# Patient Record
Sex: Male | Born: 1955 | Race: White | Hispanic: No | Marital: Married | Smoking: Never smoker
Health system: Southern US, Community
[De-identification: ages and names within clinical notes are randomized; demographics above are authoritative.]

## PROBLEM LIST (undated history)

## (undated) DIAGNOSIS — Z87442 Personal history of urinary calculi: Secondary | ICD-10-CM

## (undated) DIAGNOSIS — M109 Gout, unspecified: Secondary | ICD-10-CM

## (undated) DIAGNOSIS — I83893 Varicose veins of bilateral lower extremities with other complications: Secondary | ICD-10-CM

## (undated) DIAGNOSIS — I10 Essential (primary) hypertension: Secondary | ICD-10-CM

## (undated) DIAGNOSIS — N433 Hydrocele, unspecified: Principal | ICD-10-CM

## (undated) DIAGNOSIS — K429 Umbilical hernia without obstruction or gangrene: Secondary | ICD-10-CM

## (undated) DIAGNOSIS — R109 Unspecified abdominal pain: Secondary | ICD-10-CM

## (undated) DIAGNOSIS — N5089 Other specified disorders of the male genital organs: Secondary | ICD-10-CM

## (undated) DIAGNOSIS — IMO0002 Reserved for concepts with insufficient information to code with codable children: Secondary | ICD-10-CM

## (undated) DIAGNOSIS — E785 Hyperlipidemia, unspecified: Secondary | ICD-10-CM

## (undated) HISTORY — DX: Varicose veins of bilateral lower extremities with other complications: I83.893

## (undated) HISTORY — DX: Personal history of urinary calculi: Z87.442

## (undated) HISTORY — DX: Hyperlipidemia, unspecified: E78.5

## (undated) HISTORY — DX: Essential (primary) hypertension: I10

## (undated) HISTORY — DX: Umbilical hernia without obstruction or gangrene: K42.9

## (undated) HISTORY — DX: Reserved for concepts with insufficient information to code with codable children: IMO0002

## (undated) HISTORY — DX: Other specified disorders of the male genital organs: N50.89

## (undated) HISTORY — DX: Gout, unspecified: M10.9

## (undated) HISTORY — DX: Hydrocele, unspecified: N43.3

## (undated) HISTORY — DX: Unspecified abdominal pain: R10.9

## (undated) HISTORY — PX: COLONOSCOPY: SHX174

---

## 2008-11-04 ENCOUNTER — Ambulatory Visit: Payer: Self-pay | Admitting: General Surgery

## 2008-11-04 LAB — HM COLONOSCOPY

## 2011-11-22 DIAGNOSIS — I83893 Varicose veins of bilateral lower extremities with other complications: Secondary | ICD-10-CM

## 2011-11-22 HISTORY — DX: Varicose veins of bilateral lower extremities with other complications: I83.893

## 2011-11-22 HISTORY — PX: KNEE SURGERY: SHX244

## 2011-12-13 ENCOUNTER — Ambulatory Visit: Payer: Self-pay | Admitting: Family Medicine

## 2011-12-13 LAB — RENAL FUNCTION PANEL
Albumin: 4 g/dL (ref 3.4–5.0)
Anion Gap: 9 (ref 7–16)
Chloride: 107 mmol/L (ref 98–107)
Co2: 30 mmol/L (ref 21–32)
Creatinine: 0.97 mg/dL (ref 0.60–1.30)
EGFR (Non-African Amer.): >60
Glucose: 90 mg/dL (ref 65–99)
Phosphorus: 3.5 mg/dL (ref 2.5–4.9)
Sodium: 146 mmol/L — ABNORMAL HIGH (ref 136–145)

## 2011-12-25 ENCOUNTER — Ambulatory Visit: Payer: Self-pay | Admitting: Specialist

## 2012-01-05 ENCOUNTER — Ambulatory Visit: Payer: Self-pay | Admitting: Specialist

## 2012-01-12 ENCOUNTER — Ambulatory Visit: Payer: Self-pay | Admitting: Specialist

## 2012-03-28 ENCOUNTER — Ambulatory Visit: Payer: Self-pay | Admitting: General Surgery

## 2012-03-30 LAB — PATHOLOGY REPORT

## 2013-01-11 ENCOUNTER — Encounter: Payer: Self-pay | Admitting: *Deleted

## 2013-02-26 ENCOUNTER — Ambulatory Visit: Payer: Self-pay | Admitting: General Surgery

## 2013-12-02 ENCOUNTER — Other Ambulatory Visit: Payer: Self-pay | Admitting: *Deleted

## 2014-07-10 ENCOUNTER — Encounter: Payer: Self-pay | Admitting: *Deleted

## 2014-07-23 ENCOUNTER — Encounter: Payer: Self-pay | Admitting: General Surgery

## 2014-07-24 ENCOUNTER — Ambulatory Visit (INDEPENDENT_AMBULATORY_CARE_PROVIDER_SITE_OTHER): Payer: No Typology Code available for payment source | Admitting: General Surgery

## 2014-07-24 ENCOUNTER — Encounter: Payer: Self-pay | Admitting: General Surgery

## 2014-07-24 VITALS — BP 142/72 | Ht 71.0 in | Wt 274.0 lb

## 2014-07-24 DIAGNOSIS — K429 Umbilical hernia without obstruction or gangrene: Secondary | ICD-10-CM

## 2014-07-24 DIAGNOSIS — N433 Hydrocele, unspecified: Secondary | ICD-10-CM

## 2014-07-24 LAB — BASIC METABOLIC PANEL
BUN: 11 mg/dL (ref 4–21)
Creatinine: 0.8 mg/dL (ref 0.6–1.3)
GLUCOSE: 95 mg/dL
Potassium: 4.2 mmol/L (ref 3.4–5.3)
Sodium: 145 mmol/L (ref 137–147)

## 2014-07-24 LAB — CBC AND DIFFERENTIAL
HCT: 40 % — AB (ref 41–53)
Hemoglobin: 14.1 g/dL (ref 13.5–17.5)
NEUTROS ABS: 1 /uL
PLATELETS: 175 10*3/uL (ref 150–399)
WBC: 3.5 10^3/mL

## 2014-07-24 LAB — HEPATIC FUNCTION PANEL
ALT: 18 U/L (ref 10–40)
AST: 16 U/L (ref 14–40)
Alkaline Phosphatase: 59 U/L (ref 25–125)
Bilirubin, Total: 0.4 mg/dL

## 2014-07-24 LAB — TSH: TSH: 2.12 u[IU]/mL (ref 0.41–5.90)

## 2014-07-24 LAB — LIPID PANEL
CHOLESTEROL: 209 mg/dL — AB (ref 0–200)
HDL: 57 mg/dL (ref 35–70)
LDL Cholesterol: 131 mg/dL
LDL/HDL RATIO: 2.3
Triglycerides: 105 mg/dL (ref 40–160)

## 2014-07-24 LAB — PSA: PSA: 0.3

## 2014-07-24 NOTE — Patient Instructions (Addendum)
Patient advised to see Urology for hydroceles. Patient to return as needed. The patient is aware to call back for any questions or concerns.  Patient has been scheduled for an appointment with Dr. Rick Duff at [place] for 08-13-14 at 9 am for further evaluation.

## 2014-07-24 NOTE — Progress Notes (Signed)
Patient ID: Larry KOLANDER Sr., male   DOB: 1956-03-08, 58 y.o.   MRN: [id number]  Chief Complaint  Patient presents with  . Other    umbilical hernia    HPIt  Larry CHIO Sr. is a 58 y.o. male here today for a evaluation of a umbilical hernia as well as progressive scrotal swellng. The patient states the hernia has been there for approximately 5 years without change or discomfort. He denies any pain in that area and states it doesn't bother him in any way.  The scrotal swelling is gradually been increasing over the last several years. He finds it uncomfortable and he is required to ride for long distances.   HPI  Past Medical History  Diagnosis Date  . Varicose veins of lower extremities with other complications 1829    Past Surgical History  Procedure Laterality Date  . Colonoscopy  2009, 2013    2009, tubulovillous adenoma the transverse colon, tubular adenoma at 30 cm.2013: Benign lymphoid mucosa at 25 cm.  . Knee surgery Right 2013    Family History  Problem Relation Age of Onset  . Colon cancer    . Colon polyps      Social History History  Substance Use Topics  . Smoking status: Never Smoker   . Smokeless tobacco: Not on file  . Alcohol Use: Yes    No Known Allergies  Current Outpatient Prescriptions  Medication Sig Dispense Refill  . AZOR 5-40 MG per tablet 1 tablet daily.       . fluticasone (FLONASE) 50 MCG/ACT nasal spray Place 1 spray into both nostrils daily.      . montelukast (SINGULAIR) 10 MG tablet Take 10 mg by mouth at bedtime.       . Olmesartan-Amlodipine-HCTZ 20-5-12.5 MG TABS Take by mouth.       No current facility-administered medications for this visit.    Review of Systems Review of Systems  Constitutional: Negative.   Respiratory: Negative.   Cardiovascular: Negative.     Blood pressure 142/72, height 5\' 11"  (1.803 m), weight 274 lb (124.286 kg).  Physical Exam Physical Exam  Constitutional: He is oriented to person, place, and  time. He appears well-developed and well-nourished.  Cardiovascular: Normal rate, regular rhythm and normal heart sounds.   Pulmonary/Chest: Effort normal and breath sounds normal.  Abdominal: Soft. Bowel sounds are normal. There is no hepatosplenomegaly. There is no tenderness. A hernia (2 cm defect at the umbilicus) is present.  Genitourinary:  Right testis is slightly larger than left.  Bilateral hydroceles that transilluminate.  Neurological: He is alert and oriented to person, place, and time.  Skin: Skin is warm and dry.    Data Reviewed PCP notes of July 09, 2014  Assessment    Symptomatic scrotal hydrocele.  Asymptomatic umbilical hernia.    Plan    Patient has been scheduled for an appointment with Dr. Rick Duff at [place] for 08-13-14 at 9 am for further evaluation. The patient is not having any symptoms from his umbilical hernia. If he is going undergoing anesthesia for repair of his hydrocele, he may consider elective repair at the same setting.    PCP and Ref. Dr. Juliette Mangle, [place] 07/25/2014, 7:42 PM

## 2014-07-25 ENCOUNTER — Encounter: Payer: Self-pay | Admitting: General Surgery

## 2014-07-25 DIAGNOSIS — N433 Hydrocele, unspecified: Secondary | ICD-10-CM

## 2014-07-25 DIAGNOSIS — K429 Umbilical hernia without obstruction or gangrene: Secondary | ICD-10-CM

## 2014-07-25 HISTORY — DX: Umbilical hernia without obstruction or gangrene: K42.9

## 2014-07-25 HISTORY — DX: Hydrocele, unspecified: N43.3

## 2015-03-15 NOTE — Op Note (Signed)
PATIENT NAME:  Larry Benjamin, Larry Benjamin MR#:  [id number] DATE OF BIRTH:  01-12-1956  DATE OF PROCEDURE:  01/12/2012  PREOPERATIVE DIAGNOSES:  1. Macerated complex tear of the posterior horn right medial meniscus.  2. 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  3. Moderate synovitis.   POSTOPERATIVE DIAGNOSES:  1. Macerated complex tear of the posterior horn right medial meniscus.  2. 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  3. Moderate synovitis.   PROCEDURES:  1. Arthroscopic partial right medial meniscectomy.  2. Arthroscopic chondroplasty lateral femoral condyle.  3. Arthroscopic partial synovectomy.   SURGEON: Park Breed, MD  ANESTHESIA: General LMA.   COMPLICATIONS: None.   DRAINS: None.   ESTIMATED BLOOD LOSS: Minimal.   REPLACEMENTS: None.   OPERATIVE FINDINGS: The patient had a complex macerated tear of the posterior horn of the medial meniscus. There was no significant degenerative change of the medial femoral condyle and minimal changes in the tibia. The intercondylar notch was normal with intact anterior and posterior cruciate ligaments. There was some moderate synovitis anteriorly. The lateral joint showed an intact meniscus and articular surfaces except for some grade III chondromalacia on the outer aspect of the lateral femoral condyle which was not significantly weight-bearing. Patellofemoral joint was normal. There were no loose bodies.   DESCRIPTION OF PROCEDURE: The patient was brought to the Operating Room where he underwent satisfactory general LMA anesthesia in the supine position. The right leg was prepped and draped in sterile fashion. Arthroscopy was carried out through standard portals. The above findings were encountered upon arthroscopy. The medial meniscus was debrided with basket forceps, motorized resector and ArthroCare wand until it was back to healthy stable tissue. Moderate synovectomy was carried out anteriorly for  visualization only. The lateral compartment showed an intact meniscus. There was some grade III chondromalacia on the anterior outer aspect of the lateral femoral condyle which was debrided with basket forceps and a motorized resector. The ArthroCare wand was used to cauterize any bleeders as the pressure was reduced. The joint was then thoroughly irrigated free of debris and stab wounds closed with 3-0 nylon suture. 0.5% Marcaine with epinephrine and morphine was placed in the joint. Dry sterile dressing was applied. Tourniquet was not used. The patient was awakened and taken to recovery in good condition. Patient has had some swelling in the leg and we will have him use below the knee TED hose for 2 to 3 weeks. In addition, he has been getting some redness in the lower leg during the day and we will keep him on Keflex 500 mg q.8 hours as a prophylaxis even though there is no evidence of cellulitis at this time.  ____________________________ Park Breed, MD hem:cms D: 01/12/2012 10:25:41 ET T: 01/12/2012 10:40:40 ET JOB#: [id number]  cc: Park Breed, MD, <Dictator> Park Breed MD ELECTRONICALLY SIGNED 01/13/2012 10:17

## 2015-06-30 ENCOUNTER — Encounter: Payer: Self-pay | Admitting: Urology

## 2015-06-30 ENCOUNTER — Ambulatory Visit (INDEPENDENT_AMBULATORY_CARE_PROVIDER_SITE_OTHER): Payer: No Typology Code available for payment source | Admitting: Urology

## 2015-06-30 VITALS — BP 157/109 | HR 68 | Ht 71.0 in | Wt 283.4 lb

## 2015-06-30 DIAGNOSIS — N433 Hydrocele, unspecified: Secondary | ICD-10-CM | POA: Diagnosis not present

## 2015-06-30 NOTE — Progress Notes (Signed)
06/30/2015 1:19 PM   Larry Bi Sr. 06/19/1956 [id number]  Referring provider: Jerrol Banana., MD [address] [facility] [place], [place] [address]  Chief Complaint  Patient presents with  . Pre-op Exam    [id number] saw me last October. Hydroceles at that point and I recommended waiting until he became uncomfortable. They're now becoming uncomfortable. Quite a large right hydrocele and a good size left hydrocele. Plan to excise these. Surgery is explained including imbrication of the hydrocele sac. Patient have a drain 48 hours postop. He realizes this is a neighbor of his abdomen recent surgery. I told him I would see him 3 weeks postop. HPI   PMH: Past Medical History  Diagnosis Date  . Varicose veins of lower extremities with other complications 7253  . History of kidney stones   . Gout   . Scrotal swelling   . Umbilical hernia   . Bilateral hydrocele   . Hypertension     Surgical History: Past Surgical History  Procedure Laterality Date  . Colonoscopy  2009, 2013    2009, tubulovillous adenoma the transverse colon, tubular adenoma at 30 cm.2013: Benign lymphoid mucosa at 25 cm.  . Knee surgery Right 2013    Home Medications:    Medication List       This list is accurate as of: 06/30/15  1:19 PM.  Always use your most recent med list.               AZOR 5-40 MG per tablet  Generic drug:  amLODipine-olmesartan  1 tablet daily.     fluticasone 50 MCG/ACT nasal spray  Commonly known as:  FLONASE  Place 1 spray into both nostrils daily.     montelukast 10 MG tablet  Commonly known as:  SINGULAIR  Take 10 mg by mouth at bedtime.        Allergies: No Known Allergies  Family History: Family History  Problem Relation Age of Onset  . Colon cancer    . Colon polyps    . Stroke Father     Social History:  reports that he has never smoked. He does not have any smokeless tobacco history on file. He reports that he drinks alcohol. He  reports that he does not use illicit drugs.  ROS: UROLOGY Frequent Urination?: No Hard to postpone urination?: No Burning/pain with urination?: No Get up at night to urinate?: No Leakage of urine?: No Urine stream starts and stops?: No Trouble starting stream?: No Do you have to strain to urinate?: No Blood in urine?: No Urinary tract infection?: No Sexually transmitted disease?: No Injury to kidneys or bladder?: No Painful intercourse?: No Weak stream?: No Erection problems?: No Penile pain?: No  Gastrointestinal Nausea?: No Vomiting?: No Indigestion/heartburn?: No Diarrhea?: No Constipation?: No  Constitutional Fever: No Night sweats?: No Weight loss?: No Fatigue?: No  Skin Skin rash/lesions?: No Itching?: No  Eyes Blurred vision?: No Double vision?: No  Ears/Nose/Throat Sore throat?: No Sinus problems?: Yes  Hematologic/Lymphatic Swollen glands?: No Easy bruising?: No  Cardiovascular Leg swelling?: No Chest pain?: No  Respiratory Cough?: No Shortness of breath?: No  Endocrine Excessive thirst?: No  Musculoskeletal Back pain?: No Joint pain?: Yes  Neurological Headaches?: No Dizziness?: No  Psychologic Depression?: No Anxiety?: No  Physical Exam: BP 157/109 mmHg  Pulse 68  Ht 5\' 11"  (1.803 m)  Wt 283 lb 6.4 oz (128.549 kg)  BMI 39.54 kg/m2  Constitutional:  Alert and oriented, No acute distress.  HEENT: [place] AT, moist mucus membranes.  Trachea midline, no masses. Cardiovascular: No clubbing, cyanosis, or edema. Respiratory: Normal respiratory effort, no increased work of breathing. GI: Abdomen is soft, nontender, nondistended, no abdominal masses GU: No CVA tenderness. Bilateral hydroceles right larger than left Skin: No rashes, bruises or suspicious lesions. Lymph: No cervical or inguinal adenopathy. Neurologic: Grossly intact, no focal deficits, moving all 4 extremities. Psychiatric: Normal mood and affect.  Laboratory  Data: No results found for: WBC, HGB, HCT, MCV, PLT  Lab Results  Component Value Date   CREATININE 0.97 12/13/2011    No results found for: PSA  No results found for: TESTOSTERONE  No results found for: HGBA1C  Urinalysis No results found for: COLORURINE, APPEARANCEUR, LABSPEC, PHURINE, GLUCOSEU, HGBUR, BILIRUBINUR, KETONESUR, PROTEINUR, UROBILINOGEN, NITRITE, LEUKOCYTESUR  Pertinent Imaging: none  Assessment and Plan: bilateral hydroceles and plan excision of hydroceles through the scrotal approach      Problem List Items Addressed This Visit    None      No Follow-up on file.  Collier Flowers, [place] [address], [place], [place] [address] [phone]

## 2015-07-27 ENCOUNTER — Other Ambulatory Visit: Payer: Self-pay | Admitting: Family Medicine

## 2015-09-02 ENCOUNTER — Other Ambulatory Visit: Payer: Self-pay | Admitting: Family Medicine

## 2015-09-02 DIAGNOSIS — E785 Hyperlipidemia, unspecified: Secondary | ICD-10-CM

## 2015-09-02 DIAGNOSIS — Q792 Exomphalos: Secondary | ICD-10-CM | POA: Insufficient documentation

## 2015-09-02 DIAGNOSIS — IMO0002 Reserved for concepts with insufficient information to code with codable children: Secondary | ICD-10-CM

## 2015-09-02 DIAGNOSIS — J309 Allergic rhinitis, unspecified: Secondary | ICD-10-CM | POA: Insufficient documentation

## 2015-09-02 DIAGNOSIS — I10 Essential (primary) hypertension: Secondary | ICD-10-CM

## 2015-09-02 HISTORY — DX: Essential (primary) hypertension: I10

## 2015-09-02 HISTORY — DX: Reserved for concepts with insufficient information to code with codable children: IMO0002

## 2015-09-02 HISTORY — DX: Hyperlipidemia, unspecified: E78.5

## 2015-09-03 ENCOUNTER — Ambulatory Visit (INDEPENDENT_AMBULATORY_CARE_PROVIDER_SITE_OTHER): Payer: No Typology Code available for payment source | Admitting: Family Medicine

## 2015-09-03 ENCOUNTER — Encounter: Payer: Self-pay | Admitting: Family Medicine

## 2015-09-03 VITALS — BP 132/86 | HR 86 | Temp 97.5°F | Resp 16 | Ht 71.0 in | Wt 283.0 lb

## 2015-09-03 DIAGNOSIS — K429 Umbilical hernia without obstruction or gangrene: Secondary | ICD-10-CM | POA: Diagnosis not present

## 2015-09-03 DIAGNOSIS — N432 Other hydrocele: Secondary | ICD-10-CM | POA: Diagnosis not present

## 2015-09-03 DIAGNOSIS — Z0000 Encounter for general adult medical examination without abnormal findings: Secondary | ICD-10-CM

## 2015-09-03 DIAGNOSIS — Z125 Encounter for screening for malignant neoplasm of prostate: Secondary | ICD-10-CM | POA: Diagnosis not present

## 2015-09-03 LAB — IFOBT (OCCULT BLOOD): IMMUNOLOGICAL FECAL OCCULT BLOOD TEST: NEGATIVE

## 2015-09-03 LAB — POCT URINALYSIS DIPSTICK
BILIRUBIN UA: NEGATIVE
GLUCOSE UA: NEGATIVE
Ketones, UA: NEGATIVE
LEUKOCYTES UA: NEGATIVE
NITRITE UA: NEGATIVE
PH UA: 6
Spec Grav, UA: 1.025
UROBILINOGEN UA: 0.2

## 2015-09-03 NOTE — Progress Notes (Signed)
Patient ID: Larry ROSAS Sr., male   DOB: Oct 30, 1956, 59 y.o.   MRN: [id number]  Visit Date: 09/03/2015  Today's Provider: Wilhemena Durie, MD   Chief Complaint  Patient presents with  . Annual Exam   Subjective:  Larry GAMEL Sr. is a 60 y.o. male who presents today for health maintenance and complete physical. He feels well. He reports exercising is none but stays active daily. He reports he is sleeping well.  LAST  colonoscopy 03/28/12-diverticulosis, 1 polyp-no path report  Tdap 09/10/08   Review of Systems  Constitutional: Negative.   HENT: Negative.   Eyes: Negative.   Respiratory: Negative.   Cardiovascular: Negative.   Endocrine: Negative.   Genitourinary: Positive for scrotal swelling.  Musculoskeletal: Negative.   Skin: Negative.   Allergic/Immunologic: Positive for environmental allergies.  Neurological: Negative.   Hematological: Negative.   Psychiatric/Behavioral: Positive for confusion.    Social History   Social History  . Marital Status: Married    Spouse Name: N/A  . Number of Children: N/A  . Years of Education: N/A   Occupational History  . Not on file.   Social History Main Topics  . Smoking status: Never Smoker   . Smokeless tobacco: Never Used  . Alcohol Use: Yes  . Drug Use: No  . Sexual Activity: Not on file   Other Topics Concern  . Not on file   Social History Narrative    Patient Active Problem List   Diagnosis Date Noted  . Allergic rhinitis 09/02/2015  . HLD (hyperlipidemia) 09/02/2015  . BP (high blood pressure) 09/02/2015  . Adult BMI 30+ 09/02/2015  . 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 Umbilical hernia without obstruction and without gangrene 07/25/2014    Past Surgical History  Procedure Laterality Date  . Colonoscopy  2009, 2013    2009, tubulovillous adenoma the transverse colon, tubular adenoma at 30 cm.2013: Benign lymphoid mucosa at 25 cm.  . Knee surgery Right 2013    His family  history includes Breast cancer in his mother; Colon cancer in an other family member; Colon polyps in an other family member; Heart attack (age of onset: 33) in his father; Hyperlipidemia in his mother; Stroke in his father.    Outpatient Prescriptions Prior to Visit  Medication Sig Dispense Refill  . AZOR 5-40 MG tablet take 1 tablet by mouth once daily 30 tablet 5  . fluticasone (FLONASE) 50 MCG/ACT nasal spray Place 1 spray into both nostrils daily.    Marland Kitchen aspirin 325 MG tablet Take by mouth.    . montelukast (SINGULAIR) 10 MG tablet Take 10 mg by mouth at bedtime.      No facility-administered medications prior to visit.    Patient Care Team: Jerrol Banana., MD as PCP - General (Family Medicine) Jerrol Banana., MD (Family Medicine) Robert Bellow, MD (General Surgery)     Objective:   Vitals:  Filed Vitals:   09/03/15 1017  BP: 132/86  Pulse: 86  Temp: 97.5 F (36.4 C)  Resp: 16  Height: 5\' 11"  (1.803 m)  Weight: 283 lb (128.368 kg)    Physical Exam  Constitutional: He is oriented to person, place, and time. He appears well-developed and well-nourished.  Obese white male in no acute distress  HENT:  Head: Normocephalic and atraumatic.  Right Ear: External ear normal.  Left Ear: External ear normal.  Nose: Nose normal.  Mouth/Throat: Oropharynx is clear and moist.  Eyes: Conjunctivae and EOM are normal. Pupils are equal, round, and reactive to light.  Neck: Normal range of motion. Neck supple.  Cardiovascular: Normal rate, regular rhythm, normal heart sounds and intact distal pulses.   Pulmonary/Chest: Effort normal and breath sounds normal.  Abdominal: Soft.  Moderate size, easily reducible umbilical hernia  Genitourinary: Rectum normal, prostate normal and penis normal.  Large right hydrocele noted  Neurological: He is alert and oriented to person, place, and time.  Skin: Skin is warm and dry.  Psychiatric: He has a normal mood and affect. His  behavior is normal. Judgment and thought content normal.     Depression Screen PHQ 2/9 Scores 09/03/2015  PHQ - 2 Score 0      Assessment & Plan:   1. Annual physical exam - CBC with Differential/Platelet - Comprehensive metabolic panel - TSH - Lipid Panel With LDL/HDL Ratio - POCT urinalysis dipstick  2. Prostate cancer screening  - PSA - IFOBT POC (occult bld, rslt in office)  3. Umbilical hernia without obstruction and without gangrene  - Ambulatory referral to General Surgery  4. Other hydrocele  - Ambulatory referral to Urology  5. Obesity Diet and exercise habits stressed to patient.   I have done the exam and reviewed the above chart and it is accurate to the best of my knowledge.

## 2015-09-05 LAB — CBC WITH DIFFERENTIAL/PLATELET
BASOS ABS: 0 10*3/uL (ref 0.0–0.2)
Basos: 1 %
EOS (ABSOLUTE): 0.3 10*3/uL (ref 0.0–0.4)
Eos: 8 %
Hematocrit: 42.3 % (ref 37.5–51.0)
Hemoglobin: 14.8 g/dL (ref 12.6–17.7)
IMMATURE GRANS (ABS): 0 10*3/uL (ref 0.0–0.1)
Immature Granulocytes: 0 %
LYMPHS: 40 %
Lymphocytes Absolute: 1.4 10*3/uL (ref 0.7–3.1)
MCH: 31.5 pg (ref 26.6–33.0)
MCHC: 35 g/dL (ref 31.5–35.7)
MCV: 90 fL (ref 79–97)
Monocytes Absolute: 0.4 10*3/uL (ref 0.1–0.9)
Monocytes: 12 %
NEUTROS ABS: 1.4 10*3/uL (ref 1.4–7.0)
NEUTROS PCT: 39 %
PLATELETS: 183 10*3/uL (ref 150–379)
RBC: 4.7 x10E6/uL (ref 4.14–5.80)
RDW: 12.9 % (ref 12.3–15.4)
WBC: 3.6 10*3/uL (ref 3.4–10.8)

## 2015-09-05 LAB — COMPREHENSIVE METABOLIC PANEL
ALK PHOS: 54 IU/L (ref 39–117)
ALT: 20 IU/L (ref 0–44)
AST: 15 IU/L (ref 0–40)
Albumin/Globulin Ratio: 2.3 (ref 1.1–2.5)
Albumin: 4.5 g/dL (ref 3.5–5.5)
BILIRUBIN TOTAL: 0.5 mg/dL (ref 0.0–1.2)
BUN/Creatinine Ratio: 16 (ref 9–20)
BUN: 12 mg/dL (ref 6–24)
CHLORIDE: 105 mmol/L (ref 97–108)
CO2: 25 mmol/L (ref 18–29)
Calcium: 9.2 mg/dL (ref 8.7–10.2)
Creatinine, Ser: 0.74 mg/dL — ABNORMAL LOW (ref 0.76–1.27)
GFR calc non Af Amer: 101 mL/min/{1.73_m2} (ref >59)
GFR, EST AFRICAN AMERICAN: 117 mL/min/{1.73_m2} (ref >59)
GLUCOSE: 86 mg/dL (ref 65–99)
Globulin, Total: 2 g/dL (ref 1.5–4.5)
POTASSIUM: 4.2 mmol/L (ref 3.5–5.2)
Sodium: 146 mmol/L — ABNORMAL HIGH (ref 134–144)
TOTAL PROTEIN: 6.5 g/dL (ref 6.0–8.5)

## 2015-09-05 LAB — LIPID PANEL WITH LDL/HDL RATIO
Cholesterol, Total: 221 mg/dL — ABNORMAL HIGH (ref 100–199)
HDL: 52 mg/dL (ref >39)
LDL Calculated: 136 mg/dL — ABNORMAL HIGH (ref 0–99)
LDL/HDL RATIO: 2.6 ratio (ref 0.0–3.6)
TRIGLYCERIDES: 163 mg/dL — AB (ref 0–149)
VLDL Cholesterol Cal: 33 mg/dL (ref 5–40)

## 2015-09-05 LAB — TSH: TSH: 1.75 u[IU]/mL (ref 0.450–4.500)

## 2015-09-05 LAB — PSA: PROSTATE SPECIFIC AG, SERUM: 0.4 ng/mL (ref 0.0–4.0)

## 2015-09-08 ENCOUNTER — Telehealth: Payer: Self-pay

## 2015-09-08 NOTE — Telephone Encounter (Signed)
-----   Message from Jerrol Banana., MD sent at 09/07/2015  2:04 PM EDT ----- Labs okay

## 2015-09-08 NOTE — Telephone Encounter (Signed)
Left message to call back  

## 2015-09-22 ENCOUNTER — Ambulatory Visit: Payer: No Typology Code available for payment source

## 2015-09-29 ENCOUNTER — Encounter: Payer: Self-pay | Admitting: General Surgery

## 2015-09-29 ENCOUNTER — Ambulatory Visit (INDEPENDENT_AMBULATORY_CARE_PROVIDER_SITE_OTHER): Payer: No Typology Code available for payment source | Admitting: General Surgery

## 2015-09-29 VITALS — BP 148/90 | HR 82 | Resp 18 | Ht 71.0 in | Wt 284.0 lb

## 2015-09-29 DIAGNOSIS — R109 Unspecified abdominal pain: Secondary | ICD-10-CM | POA: Diagnosis not present

## 2015-09-29 DIAGNOSIS — N433 Hydrocele, unspecified: Secondary | ICD-10-CM

## 2015-09-29 DIAGNOSIS — K429 Umbilical hernia without obstruction or gangrene: Secondary | ICD-10-CM | POA: Diagnosis not present

## 2015-09-29 NOTE — Patient Instructions (Addendum)
The patient is aware to call back for any questions or concerns.  Abdominal Ultrasound for right flank pain. Appointment with urology for scrotal hydrocele.   Hernia, Adult A hernia is the bulging of an organ or tissue through a weak spot in the muscles of the abdomen (abdominal wall). Hernias develop most often near the navel or groin. There are many kinds of hernias. Common kinds include:  Femoral hernia. This kind of hernia develops under the groin in the upper thigh area.  Inguinal hernia. This kind of hernia develops in the groin or scrotum.  Umbilical hernia. This kind of hernia develops near the navel.  Hiatal hernia. This kind of hernia causes part of the stomach to be pushed up into the chest.  Incisional hernia. This kind of hernia bulges through a scar from an abdominal surgery. CAUSES This condition may be caused by:  Heavy lifting.  Coughing over a long period of time.  Straining to have a bowel movement.  An incision made during an abdominal surgery.  A birth defect (congenital defect).  Excess weight or obesity.  Smoking.  Poor nutrition.  Cystic fibrosis.  Excess fluid in the abdomen.  Undescended testicles. SYMPTOMS Symptoms of a hernia include:  A lump on the abdomen. This is the first sign of a hernia. The lump may become more obvious with standing, straining, or coughing. It may get bigger over time if it is not treated or if the condition causing it is not treated.  Pain. A hernia is usually painless, but it may become painful over time if treatment is delayed. The pain is usually dull and may get worse with standing or lifting heavy objects. Sometimes a hernia gets tightly squeezed in the weak spot (strangulated) or stuck there (incarcerated) and causes additional symptoms. These symptoms may include:  Vomiting.  Nausea.  Constipation.  Irritability. DIAGNOSIS A hernia may be diagnosed with:  A physical exam. During the exam your health  care provider may ask you to cough or to make a specific movement, because a hernia is usually more visible when you move.  Imaging tests. These can include:  X-rays.  Ultrasound.  CT scan. TREATMENT A hernia that is small and painless may not need to be treated. A hernia that is large or painful may be treated with surgery. Inguinal hernias may be treated with surgery to prevent incarceration or strangulation. Strangulated hernias are always treated with surgery, because lack of blood to the trapped organ or tissue can cause it to die. Surgery to treat a hernia involves pushing the bulge back into place and repairing the weak part of the abdomen. HOME CARE INSTRUCTIONS  Avoid straining.  Do not lift anything heavier than 10 lb (4.5 kg).  Lift with your leg muscles, not your back muscles. This helps avoid strain.  When coughing, try to cough gently.  Prevent constipation. Constipation leads to straining with bowel movements, which can make a hernia worse or cause a hernia repair to break down. You can prevent constipation by:  Eating a high-fiber diet that includes plenty of fruits and vegetables.  Drinking enough fluids to keep your urine clear or pale yellow. Aim to drink 6-8 glasses of water per day.  Using a stool softener as directed by your health care provider.  Lose weight, if you are overweight.  Do not use any tobacco products, including cigarettes, chewing tobacco, or electronic cigarettes. If you need help quitting, ask your health care provider.  Keep all follow-up visits  as directed by your health care provider. This is important. Your health care provider may need to monitor your condition. SEEK MEDICAL CARE IF:  You have swelling, redness, and pain in the affected area.  Your bowel habits change. SEEK IMMEDIATE MEDICAL CARE IF:  You have a fever.  You have abdominal pain that is getting worse.  You feel nauseous or you vomit.  You cannot push the hernia  back in place by gently pressing on it while you are lying down.  The hernia:  Changes in shape or size.  Is stuck outside the abdomen.  Becomes discolored.  Feels hard or tender.   This information is not intended to replace advice given to you by your health care provider. Make sure you discuss any questions you have with your health care provider.   Document Released: 11/07/2005 Document Revised: 11/28/2014 Document Reviewed: 09/17/2014 Elsevier Interactive Patient Education [employer].  Patient is scheduled for an abdominal ultrasound at [facility] on 10/02/15 at 8:00 am. He is to arrive by 7:45 am and have nothing to eat or drink after midnight. Patient is aware of date, time, and instructions.

## 2015-09-29 NOTE — Progress Notes (Signed)
Patient ID: Larry DIEBEL Sr., male   DOB: 1956/06/22, 59 y.o.   MRN: [id number]  Chief Complaint  Patient presents with  . Hernia    umbilical    HPI Larry ECKENRODE Sr. is a 59 y.o. male.  Here today for re evaluation of an umbilical hernia, he was seen last year for this hernia. The patient states the hernia has been there for approximately 5 years. He does still have the scrotal swelling and he is ready to have surgery. Bowels move regular and daily. He does have occasional right flank pain worse at night. He states it started about 6 months ago.  The patient had seen Dr. Elnoria Howard in regards to his hydrocele. Dr. Elnoria Howard has retired. We'll need to set him up with the urology service for reassessment as he desires to have the hernia and hydroceles repaired.  I personally reviewed the history with the patient.  HPI  Past Medical History  Diagnosis Date  . Varicose veins of lower extremities with other complications 5465  . History of kidney stones   . Gout   . Scrotal swelling   . Umbilical hernia   . Bilateral hydrocele   . Hypertension     Past Surgical History  Procedure Laterality Date  . Colonoscopy  2009, 2013    2009, tubulovillous adenoma the transverse colon, tubular adenoma at 30 cm.2013: Benign lymphoid mucosa at 25 cm.  . Knee surgery Right 2013    Family History  Problem Relation Age of Onset  . Colon cancer    . Colon polyps    . Stroke Father   . Heart attack Father 15  . Hyperlipidemia Mother   . Breast cancer Mother     Social History Social History  Substance Use Topics  . Smoking status: Never Smoker   . Smokeless tobacco: Never Used  . Alcohol Use: Yes    Allergies  Allergen Reactions  . Dust Mite Extract   . Tree Extract     Current Outpatient Prescriptions  Medication Sig Dispense Refill  . AZOR 5-40 MG tablet take 1 tablet by mouth once daily 30 tablet 5  . fluticasone (FLONASE) 50 MCG/ACT nasal spray Place 1 spray into both nostrils daily.      No current facility-administered medications for this visit.    Review of Systems Review of Systems  Constitutional: Negative.   Respiratory: Negative.   Cardiovascular: Negative.     Blood pressure 148/90, pulse 82, resp. rate 18, height 5\' 11"  (1.803 m), weight 284 lb (128.822 kg).  Physical Exam Physical Exam  Constitutional: He is oriented to person, place, and time. He appears well-developed and well-nourished.  HENT:  Mouth/Throat: Oropharynx is clear and moist.  Eyes: Conjunctivae are normal. No scleral icterus.  Neck: Neck supple.  Cardiovascular: Normal rate, regular rhythm and normal heart sounds.   Pulmonary/Chest: Effort normal and breath sounds normal.    Abdominal: Normal appearance. There is no tenderness.  2 cm umbilical defect.  Genitourinary:  scrotal hydrocele.  Lymphadenopathy:    He has no cervical adenopathy.  Neurological: He is alert and oriented to person, place, and time.  Skin: Skin is warm and dry.  Psychiatric: His behavior is normal.    Data Reviewed PCP notes of October 2016.  Assessment    Increasingly symptomatic umbilical hernia.  Atypical right flank pain.  Hydrocele, right greater than left.    Plan    We'll arrange for an abdominal ultrasound. If this is unremarkable  we'll reassess for surgery with urology for management of his hernia and hydrocele.     Abdominal Ultrasound for right flank pain. Appointment with urology for scrotal hydrocele.  Patient is scheduled for an abdominal ultrasound at [facility] on 10/02/15 at 8:00 am. He is to arrive by 7:45 am and have nothing to eat or drink after midnight. Patient is aware of date, time, and instructions.   PCP:  Philemon Kingdom 09/30/2015, 6:29 AM

## 2015-09-30 ENCOUNTER — Telehealth: Payer: Self-pay

## 2015-09-30 DIAGNOSIS — R109 Unspecified abdominal pain: Secondary | ICD-10-CM | POA: Insufficient documentation

## 2015-09-30 DIAGNOSIS — R10A1 Flank pain, right side: Secondary | ICD-10-CM | POA: Insufficient documentation

## 2015-09-30 HISTORY — DX: Unspecified abdominal pain: R10.9

## 2015-09-30 NOTE — Telephone Encounter (Signed)
-----   Message from Robert Bellow, MD sent at 09/30/2015  6:31 AM EST ----- See if he needed an appointment with the urology service for next week for preop assessment for hydrocele. Previously sought Dr. Elnoria Howard. Looking to complete umbilical hernia repair and hydrocele removal at the same setting. He does not need to see the PA again unless they are able to schedule for the surgeon.Marland Kitchen

## 2015-09-30 NOTE — Telephone Encounter (Signed)
Spoke with patient and he does want [place] to schedule him to see Urology next week. Patient is scheduled to see Dr Baruch Gouty at [facility] on 10/08/15 at 1:30 pm for pre op hydrocele assessment. Patient is aware of date, time, and instructions.

## 2015-10-01 ENCOUNTER — Ambulatory Visit: Payer: No Typology Code available for payment source | Admitting: General Surgery

## 2015-10-02 ENCOUNTER — Telehealth: Payer: Self-pay

## 2015-10-02 ENCOUNTER — Ambulatory Visit
Admission: RE | Admit: 2015-10-02 | Discharge: 2015-10-02 | Disposition: A | Discharge status: Home / Self Care | Payer: No Typology Code available for payment source | Source: Ambulatory Visit | Attending: General Surgery | Admitting: General Surgery

## 2015-10-02 DIAGNOSIS — K760 Fatty (change of) liver, not elsewhere classified: Secondary | ICD-10-CM | POA: Insufficient documentation

## 2015-10-02 DIAGNOSIS — R109 Unspecified abdominal pain: Secondary | ICD-10-CM | POA: Diagnosis present

## 2015-10-02 DIAGNOSIS — N200 Calculus of kidney: Secondary | ICD-10-CM | POA: Insufficient documentation

## 2015-10-02 NOTE — Telephone Encounter (Signed)
Notified patient as instructed, patient pleased. Discussed follow-up appointments, patient agrees  

## 2015-10-02 NOTE — Telephone Encounter (Signed)
-----   Message from Robert Bellow, MD sent at 10/02/2015  9:36 AM EST ----- Notify patient ultrasound ok.  ----- Message -----    From: Rad Results In Interface    Sent: 10/02/2015   9:16 AM      To: Robert Bellow, MD

## 2015-10-08 ENCOUNTER — Encounter: Payer: Self-pay | Admitting: Urology

## 2015-10-08 ENCOUNTER — Ambulatory Visit (INDEPENDENT_AMBULATORY_CARE_PROVIDER_SITE_OTHER): Payer: No Typology Code available for payment source | Admitting: Urology

## 2015-10-08 VITALS — BP 166/85 | HR 93 | Ht 71.0 in | Wt 285.6 lb

## 2015-10-08 DIAGNOSIS — N433 Hydrocele, unspecified: Secondary | ICD-10-CM

## 2015-10-08 DIAGNOSIS — N200 Calculus of kidney: Secondary | ICD-10-CM

## 2015-10-08 NOTE — Progress Notes (Signed)
10/08/2015 2:20 PM   Larry Benjamin [birth year] [id number]  Referring provider: Jerrol Banana., MD [address] [place], [place] [address]  Chief Complaint  Patient presents with  . Hydrocele    HPI: The patient is a 59 year old male presents for follow-up of his right hydrocele. He states that other than having problems with urinating on himself that he is not symptomatic. He does not find this bothersome that he urinates himself. He is unsure if he underwent once undergo surgery at this time. He also has a potential stone on renal ultrasound. He does have history of kidney stones.   PMH: Past Medical History  Diagnosis Date  . Varicose veins of lower extremities with other complications 0000000  . History of kidney stones   . Gout   . Scrotal swelling   . Umbilical hernia   . Bilateral hydrocele   . Hypertension     Surgical History: Past Surgical History  Procedure Laterality Date  . Colonoscopy  2009, 2013    2009, tubulovillous adenoma the transverse colon, tubular adenoma at 30 cm.2013: Benign lymphoid mucosa at 25 cm.  . Knee surgery Right 2013    Home Medications:    Medication List       This list is accurate as of: 10/08/15  2:20 PM.  Always use your most recent med list.               AZOR 5-40 MG tablet  Generic drug:  amLODipine-olmesartan  take 1 tablet by mouth once daily     fluticasone 50 MCG/ACT nasal spray  Commonly known as:  FLONASE  Place 1 spray into both nostrils daily.        Allergies:  Allergies  Allergen Reactions  . Dust Mite Extract   . Tree Extract     Family History: Family History  Problem Relation Age of Onset  . Colon cancer    . Colon polyps    . Stroke Father   . Heart attack Father 50  . Hyperlipidemia Mother   . Breast cancer Mother     Social History:  reports that he has never smoked. He has never used smokeless tobacco. He reports that he drinks alcohol. He reports that he does not  use illicit drugs.  ROS: UROLOGY Frequent Urination?: No Hard to postpone urination?: No Burning/pain with urination?: No Get up at night to urinate?: No Leakage of urine?: No Urine stream starts and stops?: No Trouble starting stream?: No Do you have to strain to urinate?: No Blood in urine?: No Urinary tract infection?: No Sexually transmitted disease?: No Injury to kidneys or bladder?: No Painful intercourse?: No Weak stream?: No Erection problems?: No Penile pain?: No  Gastrointestinal Nausea?: No Vomiting?: No Indigestion/heartburn?: No Diarrhea?: No Constipation?: No  Constitutional Fever: No Night sweats?: No Weight loss?: No Fatigue?: No  Skin Skin rash/lesions?: No Itching?: No  Eyes Blurred vision?: No Double vision?: No  Ears/Nose/Throat Sore throat?: No Sinus problems?: No  Hematologic/Lymphatic Swollen glands?: No Easy bruising?: No  Cardiovascular Leg swelling?: No Chest pain?: No  Respiratory Cough?: No Shortness of breath?: No  Endocrine Excessive thirst?: No  Musculoskeletal Back pain?: No Joint pain?: No  Neurological Headaches?: No Dizziness?: No  Psychologic Depression?: No Anxiety?: No  Physical Exam: BP 166/85 mmHg  Pulse 93  Ht 5\' 11"  (1.803 m)  Wt 285 lb 9.6 oz (129.547 kg)  BMI 39.85 kg/m2  Constitutional:  Alert and oriented, No acute  distress. HEENT: [place] AT, moist mucus membranes.  Trachea midline, no masses. Cardiovascular: No clubbing, cyanosis, or edema. Respiratory: Normal respiratory effort, no increased work of breathing. GI: Abdomen is soft, nontender, nondistended, no abdominal masses GU: No CVA tenderness. There is a large right hydrocele. Left testicle is normal. Skin: No rashes, bruises or suspicious lesions. Lymph: No cervical or inguinal adenopathy. Neurologic: Grossly intact, no focal deficits, moving all 4 extremities. Psychiatric: Normal mood and affect.  Laboratory Data: Lab Results    Component Value Date   WBC 3.6 09/04/2015   HGB 14.1 07/24/2014   HCT 42.3 09/04/2015   PLT 175 07/24/2014    Lab Results  Component Value Date   CREATININE 0.74* 09/04/2015    Lab Results  Component Value Date   PSA 0.4 09/04/2015   PSA 0.3 07/24/2014    No results found for: TESTOSTERONE  No results found for: HGBA1C  Urinalysis    Component Value Date/Time   BILIRUBINUR negative 09/03/2015 1211   PROTEINUR trace 09/03/2015 1211   UROBILINOGEN 0.2 09/03/2015 1211   NITRITE negative 09/03/2015 1211   LEUKOCYTESUR Negative 09/03/2015 1211    Pertinent Imaging: Hepatic steatosis.  3.4 cm right renal sinus cyst.  7 mm interpolar right renal calculus. No hydronephrosis.   Assessment & Plan:    1. Hydrocele of testis The patient is unsure if he wants to undergo hydrocelectomy this time. I discussed with the patient that the only indication for hydrocelectomy is if the patient is bothered by it. He is not sure at this time if he finds it bothersome enough to undergo surgery. I did discuss the surgery with him. He is aware that there is risk of bleeding infection and postoperative hematoma. He is also aware that he will need to to avoid any physical activity or strenuous activity for 1-2 weeks postoperatively. He is not sure if he wants to take this much time off of work. He will call the office if he decides to undergo the operation. It will be scheduled with an umbilical hernia repair with Dr. Bary Castilla the patient elects to proceed.  2. Right renal stone The patient has an ultrasound that suggests a possible right renal stone. We will get a KUB to further evaluate. We'll call the patient in for a follow-up appointment if KUB is positive for stone.  Return for call to schedule surgery .  Nickie Retort, MD  [facility] [address] [place],  [address] [phone]

## 2015-10-12 ENCOUNTER — Ambulatory Visit
Admission: RE | Admit: 2015-10-12 | Discharge: 2015-10-12 | Disposition: A | Discharge status: Home / Self Care | Payer: No Typology Code available for payment source | Source: Ambulatory Visit | Attending: Urology | Admitting: Urology

## 2015-10-12 DIAGNOSIS — N200 Calculus of kidney: Secondary | ICD-10-CM | POA: Diagnosis not present

## 2015-10-13 ENCOUNTER — Telehealth: Payer: Self-pay

## 2015-10-13 NOTE — Telephone Encounter (Signed)
Pt called requesting KUB results. Please advise.

## 2015-10-14 NOTE — Telephone Encounter (Signed)
KUB results provided to the patient.    He stated that he had a "kidney stone attack" on Monday evening and wanted to let you know.  He would like to proceed with the scheduling of surgery, but may have to be after the first of the year depending on his schedule.

## 2015-10-14 NOTE — Telephone Encounter (Signed)
No obvious right renal stone on KUB.

## 2015-10-30 ENCOUNTER — Telehealth: Payer: Self-pay | Admitting: Radiology

## 2015-10-30 NOTE — Telephone Encounter (Signed)
Pt would like to proceed with hydrocelectomy.  He would like this coordinated along with an umbilical hernia repair with Dr Bary Castilla in January.  Pt was last seen 10/08/15 in our office. Would you like him to come to the office for a preop appt?

## 2015-10-30 NOTE — Telephone Encounter (Signed)
Ok to schedule right hydrocelectomy with general surgery

## 2015-11-05 ENCOUNTER — Telehealth: Payer: Self-pay | Admitting: *Deleted

## 2015-11-05 NOTE — Telephone Encounter (Signed)
LMOM for pt to return call. Need to notify of pre-admit testing appt & surgery.

## 2015-11-05 NOTE — Telephone Encounter (Signed)
Message left for patient to call the office.   Umbilical hernia repair to be completed by Dr. Bary Castilla has been coordinated with Dr. Pilar Jarvis from [facility].  We need to review surgery date (12-03-15) and pre-admit appointment day and time (11-26-15 at 9:45 am-this was arranged by Dr. Carlynn Purl office) with the patient.  No pre-op visit will be required by Dr. Bary Castilla. History and physical will be updated the morning of surgery.   Please confirm with patient that insurance will still be the same in January.

## 2015-11-05 NOTE — Telephone Encounter (Signed)
Notified pt of surgery scheduled on 12/03/15 with Dr Pilar Jarvis doing a hydrocelectomy followed by Dr Bary Castilla doing a hernia repair. Notified pt of pre-admit testing appt on 11/26/15 @9 :45 and to call day prior to surgery for arrival time to SDS. Pt voices understanding.

## 2015-11-06 NOTE — Telephone Encounter (Signed)
Spoke with the patient and he is aware of surgery and pre op date. He states that his insurance will remain the same in January.

## 2015-11-13 ENCOUNTER — Other Ambulatory Visit: Payer: Self-pay | Admitting: General Surgery

## 2015-11-13 DIAGNOSIS — K429 Umbilical hernia without obstruction or gangrene: Secondary | ICD-10-CM

## 2015-11-26 ENCOUNTER — Encounter
Admission: RE | Admit: 2015-11-26 | Discharge: 2015-11-26 | Disposition: A | Discharge status: Home / Self Care | Payer: No Typology Code available for payment source | Source: Ambulatory Visit | Attending: General Surgery | Admitting: General Surgery

## 2015-11-26 DIAGNOSIS — Z01810 Encounter for preprocedural cardiovascular examination: Secondary | ICD-10-CM | POA: Insufficient documentation

## 2015-11-26 DIAGNOSIS — Z01812 Encounter for preprocedural laboratory examination: Secondary | ICD-10-CM | POA: Insufficient documentation

## 2015-11-26 LAB — CBC
HCT: 41.8 % (ref 40.0–52.0)
Hemoglobin: 14 g/dL (ref 13.0–18.0)
MCH: 31.2 pg (ref 26.0–34.0)
MCHC: 33.6 g/dL (ref 32.0–36.0)
MCV: 93.1 fL (ref 80.0–100.0)
Platelets: 182 10*3/uL (ref 150–440)
RBC: 4.49 MIL/uL (ref 4.40–5.90)
RDW: 13.1 % (ref 11.5–14.5)
WBC: 4.2 10*3/uL (ref 3.8–10.6)

## 2015-11-26 LAB — PROTIME-INR
INR: 0.99
Prothrombin Time: 13.3 seconds (ref 11.4–15.0)

## 2015-11-26 LAB — URINALYSIS COMPLETE WITH MICROSCOPIC (ARMC ONLY)
BILIRUBIN URINE: NEGATIVE
Bacteria, UA: NONE SEEN
Glucose, UA: NEGATIVE mg/dL
Hgb urine dipstick: NEGATIVE
KETONES UR: NEGATIVE mg/dL
Leukocytes, UA: NEGATIVE
Nitrite: NEGATIVE
PROTEIN: NEGATIVE mg/dL
Specific Gravity, Urine: 1.013 (ref 1.005–1.030)
pH: 7 (ref 5.0–8.0)

## 2015-11-26 LAB — BASIC METABOLIC PANEL
ANION GAP: 7 (ref 5–15)
BUN: 12 mg/dL (ref 6–20)
CALCIUM: 8.7 mg/dL — AB (ref 8.9–10.3)
CO2: 29 mmol/L (ref 22–32)
Chloride: 104 mmol/L (ref 101–111)
Creatinine, Ser: 0.68 mg/dL (ref 0.61–1.24)
GFR calc Af Amer: >60 mL/min (ref >60)
GLUCOSE: 101 mg/dL — AB (ref 65–99)
Potassium: 3.9 mmol/L (ref 3.5–5.1)
Sodium: 140 mmol/L (ref 135–145)

## 2015-11-26 LAB — DIFFERENTIAL
BASOS ABS: 0 10*3/uL (ref 0–0.1)
BASOS PCT: 1 %
EOS ABS: 0.1 10*3/uL (ref 0–0.7)
Eosinophils Relative: 3 %
Lymphocytes Relative: 33 %
Lymphs Abs: 1.4 10*3/uL (ref 1.0–3.6)
Monocytes Absolute: 0.4 10*3/uL (ref 0.2–1.0)
Monocytes Relative: 10 %
NEUTROS PCT: 53 %
Neutro Abs: 2.2 10*3/uL (ref 1.4–6.5)

## 2015-11-26 LAB — APTT: APTT: 29 s (ref 24–36)

## 2015-11-26 NOTE — Patient Instructions (Signed)
  Your procedure is scheduled on: Thursday Jan. 9, 2017. Report to Same Day Surgery. To find out your arrival time please call [phone] between 1PM - 3PM on Wednesday Jan. 8, 2017.  Remember: Instructions that are not followed completely may result in serious medical risk, up to and including death, or upon the discretion of your surgeon and anesthesiologist your surgery may need to be rescheduled.    _x___ 1. Do not eat food or drink liquids after midnight. No gum chewing or hard candies.     _x___ 2. No Alcohol for 24 hours before or after surgery.   ____ 3. Bring all medications with you on the day of surgery if instructed.    __x__ 4. Notify your doctor if there is any change in your medical condition     (cold, fever, infections).     Do not wear jewelry, make-up, hairpins, clips or nail polish.  Do not wear lotions, powders, or perfumes. You may wear deodorant.  Do not shave 48 hours prior to surgery. Men may shave face and neck.  Do not bring valuables to the hospital.    [facility] is not responsible for any belongings or valuables.               Contacts, dentures or bridgework may not be worn into surgery.  Leave your suitcase in the car. After surgery it may be brought to your room.  For patients admitted to the hospital, discharge time is determined by your treatment team.   Patients discharged the day of surgery will not be allowed to drive home.    Please read over the following fact sheets that you were given:   [facility] Preparing for Surgery  __x__ Take these medicines the morning of surgery with A SIP OF WATER:    1. AZOR    ____ Fleet Enema (as directed)   _x___ Use CHG Soap as directed  ____ Use inhalers on the day of surgery  ____ Stop metformin 2 days prior to surgery    ____ Take 1/2 of usual insulin dose the night before surgery and none on the morning of surgery.   ____ Stop Coumadin/Plavix/aspirin on does not apply.  _x___ Stop  Anti-inflammatories such as ibuprofen now.   ____ Stop supplements until after surgery.    ____ Bring C-Pap to the hospital.

## 2015-12-03 ENCOUNTER — Encounter
Admission: RE | Disposition: A | Discharge status: Home / Self Care | Payer: Self-pay | Source: Ambulatory Visit | Attending: General Surgery

## 2015-12-03 ENCOUNTER — Encounter: Payer: Self-pay | Admitting: *Deleted

## 2015-12-03 ENCOUNTER — Ambulatory Visit
Admission: RE | Admit: 2015-12-03 | Discharge: 2015-12-03 | Disposition: A | Discharge status: Home / Self Care | Payer: No Typology Code available for payment source | Source: Ambulatory Visit | Attending: General Surgery | Admitting: General Surgery

## 2015-12-03 DIAGNOSIS — R05 Cough: Secondary | ICD-10-CM | POA: Insufficient documentation

## 2015-12-03 DIAGNOSIS — K429 Umbilical hernia without obstruction or gangrene: Secondary | ICD-10-CM | POA: Insufficient documentation

## 2015-12-03 DIAGNOSIS — I10 Essential (primary) hypertension: Secondary | ICD-10-CM | POA: Insufficient documentation

## 2015-12-03 DIAGNOSIS — M109 Gout, unspecified: Secondary | ICD-10-CM | POA: Insufficient documentation

## 2015-12-03 DIAGNOSIS — R509 Fever, unspecified: Secondary | ICD-10-CM | POA: Diagnosis not present

## 2015-12-03 DIAGNOSIS — Z86010 Personal history of colonic polyps: Secondary | ICD-10-CM | POA: Diagnosis not present

## 2015-12-03 DIAGNOSIS — Z87442 Personal history of urinary calculi: Secondary | ICD-10-CM | POA: Insufficient documentation

## 2015-12-03 DIAGNOSIS — I83893 Varicose veins of bilateral lower extremities with other complications: Secondary | ICD-10-CM | POA: Insufficient documentation

## 2015-12-03 DIAGNOSIS — N5089 Other specified disorders of the male genital organs: Secondary | ICD-10-CM | POA: Diagnosis not present

## 2015-12-03 DIAGNOSIS — N433 Hydrocele, unspecified: Secondary | ICD-10-CM | POA: Diagnosis not present

## 2015-12-03 DIAGNOSIS — Z539 Procedure and treatment not carried out, unspecified reason: Secondary | ICD-10-CM | POA: Diagnosis not present

## 2015-12-03 SURGERY — REPAIR, HERNIA, UMBILICAL, ADULT
Anesthesia: Choice | Laterality: Right

## 2015-12-03 MED ORDER — CEFAZOLIN SODIUM-DEXTROSE 2-3 GM-% IV SOLR: 2.0000 g | INTRAVENOUS | Status: DC

## 2015-12-03 MED ORDER — LACTATED RINGERS IV SOLN
INTRAVENOUS | Status: DC
  Administered 2015-12-03: 07:00:00 via INTRAVENOUS

## 2015-12-03 MED ORDER — FAMOTIDINE 20 MG PO TABS
ORAL_TABLET | ORAL | Status: AC
  Administered 2015-12-03: 20 mg via ORAL
  Filled 2015-12-03: qty 1

## 2015-12-03 MED ORDER — FAMOTIDINE 20 MG PO TABS
20.0000 mg | ORAL_TABLET | Freq: Once | ORAL | Status: AC
  Administered 2015-12-03: 20 mg via ORAL

## 2015-12-03 MED ORDER — CEFAZOLIN SODIUM-DEXTROSE 2-3 GM-% IV SOLR
INTRAVENOUS | Status: AC
  Filled 2015-12-03: qty 50

## 2015-12-03 SURGICAL SUPPLY — 31 items
BLADE SURG 15 STRL SS SAFETY (BLADE) ×2 IMPLANT
CANISTER SUCT 1200ML W/VALVE (MISCELLANEOUS) ×2 IMPLANT
CHLORAPREP W/TINT 26ML (MISCELLANEOUS) ×2 IMPLANT
CLOSURE WOUND 1/2 X4 (GAUZE/BANDAGES/DRESSINGS)
DRAPE LAPAROTOMY 100X77 ABD (DRAPES) ×2 IMPLANT
DRESSING TELFA 4X3 1S ST N-ADH (GAUZE/BANDAGES/DRESSINGS) ×2 IMPLANT
DRSG TEGADERM 4X4.75 (GAUZE/BANDAGES/DRESSINGS) ×2 IMPLANT
GAUZE SPONGE 4X4 12PLY STRL (GAUZE/BANDAGES/DRESSINGS) ×2 IMPLANT
GLOVE BIO SURGEON STRL SZ7.5 (GLOVE) ×2 IMPLANT
GLOVE INDICATOR 8.0 STRL GRN (GLOVE) ×2 IMPLANT
GOWN STRL REUS W/ TWL LRG LVL3 (GOWN DISPOSABLE) ×4 IMPLANT
GOWN STRL REUS W/TWL LRG LVL3 (GOWN DISPOSABLE)
KIT RM TURNOVER STRD PROC AR (KITS) ×2 IMPLANT
LABEL OR SOLS (LABEL) ×2 IMPLANT
NDL HYPO 25X1 1.5 SAFETY (NEEDLE) ×2 IMPLANT
NDL SAFETY 22GX1.5 (NEEDLE) ×4 IMPLANT
NEEDLE HYPO 25X1 1.5 SAFETY (NEEDLE) IMPLANT
NS IRRIG 500ML POUR BTL (IV SOLUTION) ×2 IMPLANT
PACK BASIN MINOR ARMC (MISCELLANEOUS) ×2 IMPLANT
PAD GROUND ADULT SPLIT (MISCELLANEOUS) ×2 IMPLANT
STRIP CLOSURE SKIN 1/2X4 (GAUZE/BANDAGES/DRESSINGS) ×2 IMPLANT
SUT PROLENE 0 CT 1 30 (SUTURE) ×2 IMPLANT
SUT SURGILON 0 BLK (SUTURE) ×4 IMPLANT
SUT VIC AB 3-0 54X BRD REEL (SUTURE) ×4 IMPLANT
SUT VIC AB 3-0 BRD 54 (SUTURE)
SUT VIC AB 3-0 SH 27 (SUTURE)
SUT VIC AB 3-0 SH 27X BRD (SUTURE) ×2 IMPLANT
SUT VIC AB 4-0 FS2 27 (SUTURE) ×2 IMPLANT
SWABSTK COMLB BENZOIN TINCTURE (MISCELLANEOUS) ×2 IMPLANT
SYR 3ML LL SCALE MARK (SYRINGE) ×2 IMPLANT
SYR CONTROL 10ML (SYRINGE) ×4 IMPLANT

## 2015-12-03 NOTE — H&P (Signed)
Larry Benjamin is an 60 y.o. male.   Chief Complaint: Umbilical hernia, hydrocele HPI: Planned a large admission for hernia and hydrocele repair.  Fever 102 yesterday by report, cough without significant sputum production. No  Past Medical History  Diagnosis Date  . History of kidney stones   . Gout   . Scrotal swelling   . Umbilical hernia   . Bilateral hydrocele   . Hypertension   . Varicose veins of lower extremities with other complications 0000000    Past Surgical History  Procedure Laterality Date  . Colonoscopy  2009, 2013    2009, tubulovillous adenoma the transverse colon, tubular adenoma at 30 cm.2013: Benign lymphoid mucosa at 25 cm.  . Knee surgery Right 2013    arthroscopy    Family History  Problem Relation Age of Onset  . Colon cancer    . Colon polyps    . Stroke Father   . Heart attack Father 3  . Hyperlipidemia Mother   . Breast cancer Mother    Social History:  reports that he has never smoked. He has never used smokeless tobacco. He reports that he drinks about 4.8 - 5.4 oz of alcohol per week. He reports that he does not use illicit drugs.  Allergies:  Allergies  Allergen Reactions  . Dust Mite Extract   . Tree Extract     No prescriptions prior to admission    No results found for this or any previous visit (from the past 48 hour(s)). No results found.  Review of Systems  Constitutional: Positive for fever (102 yesterday by patient report).  HENT: Negative.   Eyes: Negative.   Respiratory: Positive for cough. Negative for hemoptysis and sputum production.   Cardiovascular: Negative.   Gastrointestinal: Negative.   Skin: Negative.     Blood pressure 131/81, pulse 81, temperature 100.9 F (38.3 C), temperature source Oral, resp. rate 16, height 5\' 11"  (1.803 m), weight 278 lb (126.1 kg), SpO2 98 %. Physical Exam  Constitutional: He appears well-developed and well-nourished.  Neck: Neck supple. No thyromegaly present.  Cardiovascular:  Normal rate and regular rhythm.   Respiratory: Effort normal and breath sounds normal.  GI: Soft. Normal appearance. He exhibits no distension.       Assessment/Plan Original plans for umbilical hernia and hydrocele repair today are canceled due to the patient's fever. We'll reschedule and 2-3 weeks. No indication for endobiotic therapy at this time.  Robert Bellow 12/03/2015, 7:50 AM

## 2015-12-03 NOTE — OR Nursing (Signed)
Pt states he has temp yesterday 102 felt like his fever broke last night.

## 2015-12-03 NOTE — OR Nursing (Signed)
Dr Pilar Jarvis and Dr Bary Castilla in to see pt surgery cancelled will recheck after fever has resolved.pt and wife  voiced understanding. Discharge ambulatory accompanied by wife.

## 2015-12-16 ENCOUNTER — Encounter: Payer: Self-pay | Admitting: General Surgery

## 2015-12-16 ENCOUNTER — Ambulatory Visit (INDEPENDENT_AMBULATORY_CARE_PROVIDER_SITE_OTHER): Payer: No Typology Code available for payment source | Admitting: General Surgery

## 2015-12-16 VITALS — BP 142/82 | HR 78 | Resp 12 | Ht 71.0 in | Wt 287.0 lb

## 2015-12-16 DIAGNOSIS — K429 Umbilical hernia without obstruction or gangrene: Secondary | ICD-10-CM | POA: Diagnosis not present

## 2015-12-16 NOTE — Progress Notes (Signed)
Patient ID: Larry CRASE Sr., male   DOB: 16-May-1956, 60 y.o.   MRN: [id number]  Chief Complaint  Patient presents with  . Pre-op Exam    umbilical hernia    HPI CASHIS Larry Sr. is a 60 y.o. male here today for his pre op umbilical hernia repair scheduled on 12/25/15. Patient states he his doing well. He had previously had a surgery scheduled several weeks ago and this was canceled as he had a fever of 102 the day prior to procedure. No source for the fever was identified. He reports he is experiencing some mild sinus pressure, but no longer any pain.  I personally reviewed the patient's history. HPI  Past Medical History  Diagnosis Date  . History of kidney stones   . Gout   . Scrotal swelling   . Umbilical hernia   . Bilateral hydrocele   . Hypertension   . Varicose veins of lower extremities with other complications 0000000    Past Surgical History  Procedure Laterality Date  . Colonoscopy  2009, 2013    2009, tubulovillous adenoma the transverse colon, tubular adenoma at 30 cm.2013: Benign lymphoid mucosa at 25 cm.  . Knee surgery Right 2013    arthroscopy    Family History  Problem Relation Age of Onset  . Colon cancer    . Colon polyps    . Stroke Father   . Heart attack Father 38  . Hyperlipidemia Mother   . Breast cancer Mother     Social History Social History  Substance Use Topics  . Smoking status: Never Smoker   . Smokeless tobacco: Never Used  . Alcohol Use: 4.8 - 5.4 oz/week    7 Glasses of wine, 1-2 Cans of beer per week    Allergies  Allergen Reactions  . Dust Mite Extract   . Tree Extract     Current Outpatient Prescriptions  Medication Sig Dispense Refill  . AZOR 5-40 MG tablet take 1 tablet by mouth once daily (Patient taking differently: take 1 tablet by mouth once daily in am) 30 tablet 5  . fluticasone (FLONASE) 50 MCG/ACT nasal spray Place 1 spray into both nostrils daily.    Marland Kitchen ibuprofen (ADVIL,MOTRIN) 200 MG tablet Take 200 mg by  mouth every 6 (six) hours as needed (3 tablets as needed.).     No current facility-administered medications for this visit.    Review of Systems Review of Systems  Constitutional: Negative.   Respiratory: Negative.   Cardiovascular: Negative.     Blood pressure 142/82, pulse 78, resp. rate 12, height 5\' 11"  (1.803 m), weight 287 lb (130.182 kg).  Physical Exam Physical Exam  Constitutional: He is oriented to person, place, and time. He appears well-developed and well-nourished.  HENT:  Head:    Eyes: Conjunctivae are normal. No scleral icterus.  Neck: Neck supple.  Cardiovascular: Normal rate and normal heart sounds.   Pulmonary/Chest: Effort normal and breath sounds normal.  Abdominal: Soft. Normal appearance. A hernia (umbilical hernia) is present.  Lymphadenopathy:    He has no cervical adenopathy.  Neurological: He is alert and oriented to person, place, and time.  Skin: Skin is warm and dry.        Assessment    Resolution of fever likely from URI source.    Plan       Patient is scheduled for umbilical hernia repair on 12/25/15. PCP:  Rosanna Randy  This information has been scribed by Gaspar Cola CMA.  Robert Bellow 12/17/2015, 5:29 PM

## 2015-12-16 NOTE — Patient Instructions (Signed)
Hernia, Adult A hernia is the bulging of an organ or tissue through a weak spot in the muscles of the abdomen (abdominal wall). Hernias develop most often near the navel or groin. There are many kinds of hernias. Common kinds include:  Femoral hernia. This kind of hernia develops under the groin in the upper thigh area.  Inguinal hernia. This kind of hernia develops in the groin or scrotum.  Umbilical hernia. This kind of hernia develops near the navel.  Hiatal hernia. This kind of hernia causes part of the stomach to be pushed up into the chest.  Incisional hernia. This kind of hernia bulges through a scar from an abdominal surgery. CAUSES This condition may be caused by:  Heavy lifting.  Coughing over a long period of time.  Straining to have a bowel movement.  An incision made during an abdominal surgery.  A birth defect (congenital defect).  Excess weight or obesity.  Smoking.  Poor nutrition.  Cystic fibrosis.  Excess fluid in the abdomen.  Undescended testicles. SYMPTOMS Symptoms of a hernia include:  A lump on the abdomen. This is the first sign of a hernia. The lump may become more obvious with standing, straining, or coughing. It may get bigger over time if it is not treated or if the condition causing it is not treated.  Pain. A hernia is usually painless, but it may become painful over time if treatment is delayed. The pain is usually dull and may get worse with standing or lifting heavy objects. Sometimes a hernia gets tightly squeezed in the weak spot (strangulated) or stuck there (incarcerated) and causes additional symptoms. These symptoms may include:  Vomiting.  Nausea.  Constipation.  Irritability. DIAGNOSIS A hernia may be diagnosed with:  A physical exam. During the exam your health care provider may ask you to cough or to make a specific movement, because a hernia is usually more visible when you move.  Imaging tests. These can  include:  X-rays.  Ultrasound.  CT scan. TREATMENT A hernia that is small and painless may not need to be treated. A hernia that is large or painful may be treated with surgery. Inguinal hernias may be treated with surgery to prevent incarceration or strangulation. Strangulated hernias are always treated with surgery, because lack of blood to the trapped organ or tissue can cause it to die. Surgery to treat a hernia involves pushing the bulge back into place and repairing the weak part of the abdomen. HOME CARE INSTRUCTIONS  Avoid straining.  Do not lift anything heavier than 10 lb (4.5 kg).  Lift with your leg muscles, not your back muscles. This helps avoid strain.  When coughing, try to cough gently.  Prevent constipation. Constipation leads to straining with bowel movements, which can make a hernia worse or cause a hernia repair to break down. You can prevent constipation by:  Eating a high-fiber diet that includes plenty of fruits and vegetables.  Drinking enough fluids to keep your urine clear or pale yellow. Aim to drink 6-8 glasses of water per day.  Using a stool softener as directed by your health care provider.  Lose weight, if you are overweight.  Do not use any tobacco products, including cigarettes, chewing tobacco, or electronic cigarettes. If you need help quitting, ask your health care provider.  Keep all follow-up visits as directed by your health care provider. This is important. Your health care provider may need to monitor your condition. SEEK MEDICAL CARE IF:  You have   swelling, redness, and pain in the affected area.  Your bowel habits change. SEEK IMMEDIATE MEDICAL CARE IF:  You have a fever.  You have abdominal pain that is getting worse.  You feel nauseous or you vomit.  You cannot push the hernia back in place by gently pressing on it while you are lying down.  The hernia:  Changes in shape or size.  Is stuck outside the  abdomen.  Becomes discolored.  Feels hard or tender.   This information is not intended to replace advice given to you by your health care provider. Make sure you discuss any questions you have with your health care provider.   Document Released: 11/07/2005 Document Revised: 11/28/2014 Document Reviewed: 09/17/2014 Elsevier Interactive Patient Education 2016 [employer] Inc[employer]  

## 2015-12-17 NOTE — H&P (Signed)
HPI  Larry BENZIGER Sr. is a 60 y.o. male here today for his pre op umbilical hernia repair scheduled on 12/25/15. Patient states he his doing well. He had previously had a surgery scheduled several weeks ago and this was canceled as he had a fever of 102 the day prior to procedure. No source for the fever was identified. He reports he is experiencing some mild sinus pressure, but no longer any pain.  I personally reviewed the patient's history.  HPI  Past Medical History   Diagnosis  Date   .  History of kidney stones    .  Gout    .  Scrotal swelling    .  Umbilical hernia    .  Bilateral hydrocele    .  Hypertension    .  Varicose veins of lower extremities with other complications  0000000    Past Surgical History   Procedure  Laterality  Date   .  Colonoscopy   2009, 2013     2009, tubulovillous adenoma the transverse colon, tubular adenoma at 30 cm.2013: Benign lymphoid mucosa at 25 cm.   .  Knee surgery  Right  2013     arthroscopy    Family History   Problem  Relation  Age of Onset   .  Colon cancer     .  Colon polyps     .  Stroke  Father    .  Heart attack  Father  49   .  Hyperlipidemia  Mother    .  Breast cancer  Mother     Social History  Social History   Substance Use Topics   .  Smoking status:  Never Smoker   .  Smokeless tobacco:  Never Used   .  Alcohol Use:  4.8 - 5.4 oz/week     7 Glasses of wine, 1-2 Cans of beer per week    Allergies   Allergen  Reactions   .  Dust Mite Extract    .  Tree Extract     Current Outpatient Prescriptions   Medication  Sig  Dispense  Refill   .  AZOR 5-40 MG tablet  take 1 tablet by mouth once daily (Patient taking differently: take 1 tablet by mouth once daily in am)  30 tablet  5   .  fluticasone (FLONASE) 50 MCG/ACT nasal spray  Place 1 spray into both nostrils daily.     Marland Kitchen  ibuprofen (ADVIL,MOTRIN) 200 MG tablet  Take 200 mg by mouth every 6 (six) hours as needed (3 tablets as needed.).      No current  facility-administered medications for this visit.    Review of Systems  Review of Systems  Constitutional: Negative.  Respiratory: Negative.  Cardiovascular: Negative.   Blood pressure 142/82, pulse 78, resp. rate 12, height 5\' 11"  (1.803 m), weight 287 lb (130.182 kg).  Physical Exam  Physical Exam  Constitutional: He is oriented to person, place, and time. He appears well-developed and well-nourished.  HENT:  Head:    Eyes: Conjunctivae are normal. No scleral icterus.  Neck: Neck supple.  Cardiovascular: Normal rate and normal heart sounds.  Pulmonary/Chest: Effort normal and breath sounds normal.  Abdominal: Soft. Normal appearance. A hernia (umbilical hernia) is present.  Lymphadenopathy:  He has no cervical adenopathy.  Neurological: He is alert and oriented to person, place, and time.  Skin: Skin is warm and dry.   Assessment   Resolution of fever likely  from URI source.  Plan   Patient is scheduled for umbilical hernia repair on 12/25/15.

## 2015-12-24 NOTE — OR Nursing (Signed)
Called Dr. Carlynn Purl office for orders and for updated H&P.  Spoke with Amy.

## 2015-12-25 ENCOUNTER — Encounter
Admission: RE | Disposition: A | Discharge status: Home / Self Care | Payer: Self-pay | Source: Ambulatory Visit | Attending: General Surgery

## 2015-12-25 ENCOUNTER — Ambulatory Visit: Payer: No Typology Code available for payment source | Admitting: *Deleted

## 2015-12-25 ENCOUNTER — Ambulatory Visit
Admission: RE | Admit: 2015-12-25 | Discharge: 2015-12-25 | Disposition: A | Discharge status: Home / Self Care | Payer: No Typology Code available for payment source | Source: Ambulatory Visit | Attending: General Surgery | Admitting: General Surgery

## 2015-12-25 ENCOUNTER — Telehealth: Payer: Self-pay

## 2015-12-25 ENCOUNTER — Encounter: Payer: Self-pay | Admitting: *Deleted

## 2015-12-25 DIAGNOSIS — M109 Gout, unspecified: Secondary | ICD-10-CM | POA: Insufficient documentation

## 2015-12-25 DIAGNOSIS — Z803 Family history of malignant neoplasm of breast: Secondary | ICD-10-CM | POA: Diagnosis not present

## 2015-12-25 DIAGNOSIS — N5089 Other specified disorders of the male genital organs: Secondary | ICD-10-CM | POA: Insufficient documentation

## 2015-12-25 DIAGNOSIS — Z800 Family history of malignant neoplasm of digestive organs: Secondary | ICD-10-CM | POA: Diagnosis not present

## 2015-12-25 DIAGNOSIS — Z8249 Family history of ischemic heart disease and other diseases of the circulatory system: Secondary | ICD-10-CM | POA: Diagnosis not present

## 2015-12-25 DIAGNOSIS — Z79899 Other long term (current) drug therapy: Secondary | ICD-10-CM | POA: Insufficient documentation

## 2015-12-25 DIAGNOSIS — Z87442 Personal history of urinary calculi: Secondary | ICD-10-CM | POA: Insufficient documentation

## 2015-12-25 DIAGNOSIS — Z9109 Other allergy status, other than to drugs and biological substances: Secondary | ICD-10-CM | POA: Insufficient documentation

## 2015-12-25 DIAGNOSIS — N433 Hydrocele, unspecified: Secondary | ICD-10-CM | POA: Insufficient documentation

## 2015-12-25 DIAGNOSIS — Z823 Family history of stroke: Secondary | ICD-10-CM | POA: Insufficient documentation

## 2015-12-25 DIAGNOSIS — I8390 Asymptomatic varicose veins of unspecified lower extremity: Secondary | ICD-10-CM | POA: Diagnosis not present

## 2015-12-25 DIAGNOSIS — Z8371 Family history of colonic polyps: Secondary | ICD-10-CM | POA: Insufficient documentation

## 2015-12-25 DIAGNOSIS — I10 Essential (primary) hypertension: Secondary | ICD-10-CM | POA: Insufficient documentation

## 2015-12-25 DIAGNOSIS — K429 Umbilical hernia without obstruction or gangrene: Secondary | ICD-10-CM | POA: Insufficient documentation

## 2015-12-25 HISTORY — PX: HYDROCELE EXCISION: SHX482

## 2015-12-25 HISTORY — PX: UMBILICAL HERNIA REPAIR: SHX196

## 2015-12-25 HISTORY — PX: HERNIA REPAIR: SHX51

## 2015-12-25 SURGERY — REPAIR, HERNIA, UMBILICAL, ADULT
Anesthesia: General | Laterality: Right | Wound class: Clean

## 2015-12-25 MED ORDER — SUGAMMADEX SODIUM 500 MG/5ML IV SOLN
INTRAVENOUS | Status: DC | PRN
Start: 2015-12-25 — End: 2015-12-25
  Administered 2015-12-25: 260 mg via INTRAVENOUS

## 2015-12-25 MED ORDER — LIDOCAINE HCL (PF) 1 % IJ SOLN
INTRAMUSCULAR | Status: AC
  Filled 2015-12-25: qty 30

## 2015-12-25 MED ORDER — ROCURONIUM BROMIDE 100 MG/10ML IV SOLN
INTRAVENOUS | Status: DC | PRN
  Administered 2015-12-25: 10 mg via INTRAVENOUS
  Administered 2015-12-25: 40 mg via INTRAVENOUS
  Administered 2015-12-25: 10 mg via INTRAVENOUS

## 2015-12-25 MED ORDER — FAMOTIDINE 20 MG PO TABS
ORAL_TABLET | ORAL | Status: AC
  Administered 2015-12-25: 20 mg via ORAL
  Filled 2015-12-25: qty 1

## 2015-12-25 MED ORDER — DEXTROSE 5 % IV SOLN
3.0000 g | INTRAVENOUS | Status: AC
  Administered 2015-12-25: 3 g via INTRAVENOUS
  Filled 2015-12-25: qty 3000

## 2015-12-25 MED ORDER — BACITRACIN ZINC 500 UNIT/GM EX OINT
TOPICAL_OINTMENT | CUTANEOUS | Status: DC | PRN
  Administered 2015-12-25: 1 via TOPICAL

## 2015-12-25 MED ORDER — FAMOTIDINE 20 MG PO TABS
20.0000 mg | ORAL_TABLET | Freq: Once | ORAL | Status: AC
  Administered 2015-12-25: 20 mg via ORAL

## 2015-12-25 MED ORDER — LIDOCAINE HCL (CARDIAC) 20 MG/ML IV SOLN
INTRAVENOUS | Status: DC | PRN
  Administered 2015-12-25: 100 mg via INTRAVENOUS

## 2015-12-25 MED ORDER — EPHEDRINE SULFATE 50 MG/ML IJ SOLN
INTRAMUSCULAR | Status: DC | PRN
  Administered 2015-12-25: 10 mg via INTRAVENOUS
  Administered 2015-12-25 (×3): 5 mg via INTRAVENOUS

## 2015-12-25 MED ORDER — FENTANYL CITRATE (PF) 100 MCG/2ML IJ SOLN
INTRAMUSCULAR | Status: DC | PRN
  Administered 2015-12-25: 25 ug via INTRAVENOUS
  Administered 2015-12-25 (×2): 100 ug via INTRAVENOUS
  Administered 2015-12-25: 25 ug via INTRAVENOUS
  Administered 2015-12-25: 50 ug via INTRAVENOUS

## 2015-12-25 MED ORDER — BUPIVACAINE HCL (PF) 0.5 % IJ SOLN
INTRAMUSCULAR | Status: DC | PRN
  Administered 2015-12-25: 30 mL

## 2015-12-25 MED ORDER — MIDAZOLAM HCL 2 MG/2ML IJ SOLN
INTRAMUSCULAR | Status: DC | PRN
  Administered 2015-12-25: 2 mg via INTRAVENOUS

## 2015-12-25 MED ORDER — HYDROCODONE-ACETAMINOPHEN 5-325 MG PO TABS: 1.0000 | ORAL_TABLET | ORAL | Status: DC | PRN

## 2015-12-25 MED ORDER — ACETAMINOPHEN 10 MG/ML IV SOLN
INTRAVENOUS | Status: AC
Start: 2015-12-25 — End: 2015-12-25
  Filled 2015-12-25: qty 100

## 2015-12-25 MED ORDER — ONDANSETRON HCL 4 MG/2ML IJ SOLN: 4.0000 mg | Freq: Once | INTRAMUSCULAR | Status: DC | PRN

## 2015-12-25 MED ORDER — ACETAMINOPHEN 10 MG/ML IV SOLN
INTRAVENOUS | Status: DC | PRN
  Administered 2015-12-25: 1000 mg via INTRAVENOUS

## 2015-12-25 MED ORDER — BUPIVACAINE HCL (PF) 0.5 % IJ SOLN
INTRAMUSCULAR | Status: AC
  Filled 2015-12-25: qty 60

## 2015-12-25 MED ORDER — BACITRACIN ZINC 500 UNIT/GM EX OINT
TOPICAL_OINTMENT | CUTANEOUS | Status: AC
  Filled 2015-12-25: qty 28.35

## 2015-12-25 MED ORDER — GLYCOPYRROLATE 0.2 MG/ML IJ SOLN
INTRAMUSCULAR | Status: DC | PRN
  Administered 2015-12-25: 0.2 mg via INTRAVENOUS

## 2015-12-25 MED ORDER — PROPOFOL 10 MG/ML IV BOLUS
INTRAVENOUS | Status: DC | PRN
  Administered 2015-12-25: 250 mg via INTRAVENOUS

## 2015-12-25 MED ORDER — BUPIVACAINE HCL 0.5 % IJ SOLN
INTRAMUSCULAR | Status: DC | PRN
  Administered 2015-12-25: 5 mL

## 2015-12-25 MED ORDER — BACITRACIN ZINC 500 UNIT/GM EX OINT
TOPICAL_OINTMENT | CUTANEOUS | Status: AC
  Filled 2015-12-25: qty 0.9

## 2015-12-25 MED ORDER — SUCCINYLCHOLINE CHLORIDE 20 MG/ML IJ SOLN
INTRAMUSCULAR | Status: DC | PRN
  Administered 2015-12-25: 120 mg via INTRAVENOUS

## 2015-12-25 MED ORDER — LACTATED RINGERS IV SOLN
INTRAVENOUS | Status: DC
Start: 2015-12-25 — End: 2015-12-25
  Administered 2015-12-25 (×2): via INTRAVENOUS

## 2015-12-25 MED ORDER — KETOROLAC TROMETHAMINE 30 MG/ML IJ SOLN
INTRAMUSCULAR | Status: DC | PRN
  Administered 2015-12-25: 30 mg via INTRAVENOUS

## 2015-12-25 MED ORDER — SODIUM BICARBONATE 4 % IV SOLN
INTRAVENOUS | Status: AC
  Filled 2015-12-25: qty 5

## 2015-12-25 MED ORDER — HYDROMORPHONE HCL 1 MG/ML IJ SOLN: 0.2500 mg | INTRAMUSCULAR | Status: DC | PRN

## 2015-12-25 MED ORDER — CEFAZOLIN SODIUM 1 G IJ SOLR
INTRAMUSCULAR | Status: DC | PRN
  Administered 2015-12-25: 1 g

## 2015-12-25 MED ORDER — CEPHALEXIN 500 MG PO CAPS: 500.0000 mg | ORAL_CAPSULE | Freq: Three times a day (TID) | ORAL | Status: DC

## 2015-12-25 SURGICAL SUPPLY — 62 items
APL SKNCLS STERI-STRIP NONHPOA (GAUZE/BANDAGES/DRESSINGS) ×2
BENZOIN TINCTURE PRP APPL 2/3 (GAUZE/BANDAGES/DRESSINGS) ×2 IMPLANT
BLADE SURG 15 STRL SS SAFETY (BLADE) ×4 IMPLANT
BNDG CMPR 75X21 PLY HI ABS (MISCELLANEOUS) ×2
CANISTER SUCT 1200ML W/VALVE (MISCELLANEOUS) ×8 IMPLANT
CHLORAPREP W/TINT 26ML (MISCELLANEOUS) ×8 IMPLANT
CLOSURE WOUND 1/2 X4 (GAUZE/BANDAGES/DRESSINGS) ×1
DRAIN PENROSE 1/4X12 LTX (DRAIN) ×4 IMPLANT
DRAPE LAPAROTOMY 100X77 ABD (DRAPES) ×4 IMPLANT
DRAPE LAPAROTOMY 77X122 PED (DRAPES) ×4 IMPLANT
DRESSING TELFA 4X3 1S ST N-ADH (GAUZE/BANDAGES/DRESSINGS) ×4 IMPLANT
DRSG TEGADERM 4X4.75 (GAUZE/BANDAGES/DRESSINGS) ×4 IMPLANT
DRSG TELFA 3X8 NADH (GAUZE/BANDAGES/DRESSINGS) ×4 IMPLANT
ELECT REM PT RETURN 9FT ADLT (ELECTROSURGICAL) ×8
ELECTRODE REM PT RTRN 9FT ADLT (ELECTROSURGICAL) ×4 IMPLANT
GAUZE FLUFF 18X24 1PLY STRL (GAUZE/BANDAGES/DRESSINGS) ×4 IMPLANT
GAUZE SPONGE 4X4 12PLY STRL (GAUZE/BANDAGES/DRESSINGS) ×4 IMPLANT
GAUZE STRETCH 2X75IN STRL (MISCELLANEOUS) ×4 IMPLANT
GLOVE BIO SURGEON STRL SZ 6.5 (GLOVE) ×4 IMPLANT
GLOVE BIO SURGEON STRL SZ7 (GLOVE) ×6 IMPLANT
GLOVE BIO SURGEON STRL SZ7.5 (GLOVE) ×6 IMPLANT
GLOVE BIO SURGEONS STRL SZ 6.5 (GLOVE) ×2
GLOVE INDICATOR 8.0 STRL GRN (GLOVE) ×6 IMPLANT
GOWN STRL REUS W/ TWL LRG LVL3 (GOWN DISPOSABLE) ×8 IMPLANT
GOWN STRL REUS W/TWL LRG LVL3 (GOWN DISPOSABLE) ×16
KIT RM TURNOVER STRD PROC AR (KITS) ×8 IMPLANT
LABEL OR SOLS (LABEL) ×6 IMPLANT
LIQUID BAND (GAUZE/BANDAGES/DRESSINGS) ×4 IMPLANT
MESH VENTRALEX ST 2.5 CRC MED (Mesh General) ×2 IMPLANT
NDL HYPO 25X1 1.5 SAFETY (NEEDLE) ×4 IMPLANT
NDL SAFETY 22GX1.5 (NEEDLE) ×8 IMPLANT
NEEDLE HYPO 25X1 1.5 SAFETY (NEEDLE) ×8 IMPLANT
NS IRRIG 500ML POUR BTL (IV SOLUTION) ×8 IMPLANT
PACK BASIN MINOR ARMC (MISCELLANEOUS) ×8 IMPLANT
PAD DRESSING TELFA 3X8 NADH (GAUZE/BANDAGES/DRESSINGS) IMPLANT
PREP PVP WINGED SPONGE (MISCELLANEOUS) ×4 IMPLANT
SOL PREP PVP 2OZ (MISCELLANEOUS) ×4
SOLUTION PREP PVP 2OZ (MISCELLANEOUS) ×2 IMPLANT
STRIP CLOSURE SKIN 1/2X4 (GAUZE/BANDAGES/DRESSINGS) ×3 IMPLANT
SUPPORETR ATHLETIC LG (MISCELLANEOUS) ×2 IMPLANT
SUPPORT SCROTAL LRG (SOFTGOODS) ×1
SUPPORT SCROTAL LRG NO STRP (SOFTGOODS) ×1 IMPLANT
SUPPORTER ATHLETIC LG (MISCELLANEOUS)
SUPPORTER ATHLETIC XL (MISCELLANEOUS) ×4
SUPPORTER ATHLETIC XL 3X44-50X (MISCELLANEOUS) IMPLANT
SUT CHROMIC 3 0 SH 27 (SUTURE) ×10 IMPLANT
SUT CHROMIC 4 0 SH 27 (SUTURE) ×2 IMPLANT
SUT ETHILON 3-0 FS-10 30 BLK (SUTURE) ×4
SUT PROLENE 0 CT 1 30 (SUTURE) ×2 IMPLANT
SUT SURGILON 0 BLK (SUTURE) ×6 IMPLANT
SUT VIC AB 3-0 54X BRD REEL (SUTURE) ×4 IMPLANT
SUT VIC AB 3-0 BRD 54 (SUTURE) ×4
SUT VIC AB 3-0 SH 27 (SUTURE) ×12
SUT VIC AB 3-0 SH 27X BRD (SUTURE) ×6 IMPLANT
SUT VIC AB 4-0 FS2 27 (SUTURE) ×4 IMPLANT
SUT VIC AB 4-0 SH 27 (SUTURE)
SUT VIC AB 4-0 SH 27XANBCTRL (SUTURE) ×2 IMPLANT
SUTURE EHLN 3-0 FS-10 30 BLK (SUTURE) ×2 IMPLANT
SWABSTK COMLB BENZOIN TINCTURE (MISCELLANEOUS) ×4 IMPLANT
SYR 3ML LL SCALE MARK (SYRINGE) ×2 IMPLANT
SYR CONTROL 10ML (SYRINGE) ×8 IMPLANT
SYRINGE 10CC LL (SYRINGE) ×4 IMPLANT

## 2015-12-25 NOTE — H&P (Addendum)
Expand All Collapse All       Larry Benjamin 07-09-1956 [id number]  Referring provider: Jerrol Banana., MD [address] [place], [place] [address]  Chief Complaint  Patient presents with  . Hydrocele    HPI: The patient is a 60 year old male presents for his right hydrocelectomy.   PMH: Past Medical History  Diagnosis Date  . Varicose veins of lower extremities with other complications 0000000  . History of kidney stones   . Gout   . Scrotal swelling   . Umbilical hernia   . Bilateral hydrocele   . Hypertension     Surgical History: Past Surgical History  Procedure Laterality Date  . Colonoscopy  2009, 2013    2009, tubulovillous adenoma the transverse colon, tubular adenoma at 30 cm.2013: Benign lymphoid mucosa at 25 cm.  . Knee surgery Right 2013    Home Medications:    Medication List       This list is accurate as of: 10/08/15 2:20 PM. Always use your most recent med list.              AZOR 5-40 MG tablet  Generic drug: amLODipine-olmesartan  take 1 tablet by mouth once daily     fluticasone 50 MCG/ACT nasal spray  Commonly known as: FLONASE  Place 1 spray into both nostrils daily.        Allergies:  Allergies  Allergen Reactions  . Dust Mite Extract   . Tree Extract     Family History: Family History  Problem Relation Age of Onset  . Colon cancer    . Colon polyps    . Stroke Father   . Heart attack Father 59  . Hyperlipidemia Mother   . Breast cancer Mother     Social History:  reports that he has never smoked. He has never used smokeless tobacco. He reports that he drinks alcohol. He reports that he does not use illicit drugs.  ROS: UROLOGY Frequent Urination?: No Hard to postpone urination?: No Burning/pain with urination?: No Get up at night to urinate?: No Leakage of urine?: No Urine stream  starts and stops?: No Trouble starting stream?: No Do you have to strain to urinate?: No Blood in urine?: No Urinary tract infection?: No Sexually transmitted disease?: No Injury to kidneys or bladder?: No Painful intercourse?: No Weak stream?: No Erection problems?: No Penile pain?: No  Gastrointestinal Nausea?: No Vomiting?: No Indigestion/heartburn?: No Diarrhea?: No Constipation?: No  Constitutional Fever: No Night sweats?: No Weight loss?: No Fatigue?: No  Skin Skin rash/lesions?: No Itching?: No  Eyes Blurred vision?: No Double vision?: No  Ears/Nose/Throat Sore throat?: No Sinus problems?: No  Hematologic/Lymphatic Swollen glands?: No Easy bruising?: No  Cardiovascular Leg swelling?: No Chest pain?: No  Respiratory Cough?: No Shortness of breath?: No  Endocrine Excessive thirst?: No  Musculoskeletal Back pain?: No Joint pain?: No  Neurological Headaches?: No Dizziness?: No  Psychologic Depression?: No Anxiety?: No  Physical Exam: BP 166/85 mmHg  Pulse 93  Ht 5\' 11"  (1.803 m)  Wt 285 lb 9.6 oz (129.547 kg)  BMI 39.85 kg/m2  Constitutional: Alert and oriented, No acute distress. HEENT:  AT, moist mucus membranes. Trachea midline, no masses. Cardiovascular: No clubbing, cyanosis, or edema. RRR Respiratory: Normal respiratory effort, no increased work of breathing. GI: Abdomen is soft, nontender, nondistended, no abdominal masses GU: No CVA tenderness. There is a large right hydrocele. Left testicle is normal. Skin: No rashes, bruises or suspicious lesions. Lymph:  No cervical or inguinal adenopathy. Neurologic: Grossly intact, no focal deficits, moving all 4 extremities. Psychiatric: Normal mood and affect.  Laboratory Data:  Recent Labs    Lab Results  Component Value Date   WBC 3.6 09/04/2015   HGB 14.1 07/24/2014   HCT 42.3 09/04/2015   PLT 175 07/24/2014       Recent Labs    Lab Results    Component Value Date   CREATININE 0.74* 09/04/2015       Recent Labs    Lab Results  Component Value Date   PSA 0.4 09/04/2015   PSA 0.3 07/24/2014       Recent Labs    No results found for: TESTOSTERONE     Recent Labs    No results found for: HGBA1C    Urinalysis  Labs (Brief)       Component Value Date/Time   BILIRUBINUR negative 09/03/2015 1211   PROTEINUR trace 09/03/2015 1211   UROBILINOGEN 0.2 09/03/2015 1211   NITRITE negative 09/03/2015 1211   LEUKOCYTESUR Negative 09/03/2015 1211      Pertinent Imaging: Hepatic steatosis.  3.4 cm right renal sinus cyst.  7 mm interpolar right renal calculus. No hydronephrosis.   Assessment & Plan:   1. Hydrocele of testis -Right hydrocelectomy  2. Possible right renal stone on ultrasound - KUB negative for stone. No further work up required  Nickie Retort, [place] [address], [place], [place] [address] [phone]

## 2015-12-25 NOTE — Op Note (Signed)
Date of procedure: 12/25/2015  Preoperative diagnosis:  1. Right hydrocele   Postoperative diagnosis:  1. Right hydrocele   Procedure: 1. Right hydrocelectomy  Surgeon: Baruch Gouty, MD  Anesthesia: General  Complications: None  Intraoperative findings: 1300 cc right hydrocele is successfully drained.  EBL: Minimal  Specimens: Right hydrocele sac  Drains: Quarter inch Penrose drain in inferior portion of right hemiscrotum  Disposition: Stable to the postanesthesia care unit  Indication for procedure: The patient is a 60 y.o. male with large right symptomatic hydrocele presents today for hydrocelectomy.  After reviewing the management options for treatment, the patient elected to proceed with the above surgical procedure(s). We have discussed the potential benefits and risks of the procedure, side effects of the proposed treatment, the likelihood of the patient achieving the goals of the procedure, and any potential problems that might occur during the procedure or recuperation. Informed consent has been obtained.  Description of procedure: The patient was met in the preoperative area. All risks, benefits, and indications of the procedure were described in great detail. The patient consented to the procedure. Preoperative antibiotics were given. The patient was taken to the operative theater. General anesthesia was induced per the anesthesia service.  The patient was placed in the supine position and prepped and draped in usual sterile fashion. A timeout was called. A 7 centimeter incision was made along the median raphe 5 the scrotum. Electrocautery was then used to dissect into the right hemiscrotum. Dartos was incised. The right hydrocele sac was freed from the surrounding scrotal attachments and delivered through the incision. A small nick was made on the anterior portion of the hydrocele sac. It was then drained with a total of 1300 cc. Excess hydrocele sac was then removed with  care not to injure the spermatic cord and vas deferens. This was labeled right hydrocele sac and sent to pathology. Hemostasis was then obtained along the edges of the hydrocele sac. The jaubulee closure then took place by reapproximating the 2 edges of the hydrocele sac posterior to the testicle. This was done with a running 3-0 chromic. Care was taken not to include the spermatic cord and vas deferens in this closure. After this approximation of hydrocele sac tissue, the testicle remained white and viable. Attention was obtained at this time to obtaining hemostasis within the right hemiscrotum. This was done with electrocautery. A quarter-inch Penrose drain was then placed in inferior portion of the right hemiscrotum and affixed to the skin. The testicles and placed back in the right hemiscrotum and again it looked viable at this time. Dartos was reapproximated with a running 3-0 chromic. Scrotal skin was reapproximated with a interrupted 4-0 chromic. Triple antibiotic ointment was placed on the incision. Scrotal fluffs were placed. Scrotal support was then placed. The arms and transferred over to general surgery for umbilical hernia repair.   Plan:   the patient will be discharged home with scrotal support which she will use for at least 1 week. He was instructed not to lift any heavy objects that weighed more than a gallon of milk for 2-3 weeks. He'll follow-up on Monday, February 6 for drain removal with our nurse practitioner. He will see me in one month to assess his incision.   Baruch Gouty, M.D.

## 2015-12-25 NOTE — Transfer of Care (Signed)
Immediate Anesthesia Transfer of Care Note  Patient: Larry GILLS Sr.  Procedure(s) Performed: Procedure(s): HERNIA REPAIR UMBILICAL ADULT, WITH MESH (N/A) HYDROCELECTOMY  (Right)  Patient Location: PACU  Anesthesia Type:General  Level of Consciousness: awake and patient cooperative  Airway & Oxygen Therapy: Patient Spontanous Breathing and Patient connected to face mask oxygen  Post-op Assessment: Report given to RN  Post vital signs: Reviewed and stable  Last Vitals:  Filed Vitals:   12/25/15 0830 12/25/15 1219  BP: 145/99 117/84  Pulse: 60 58  Temp: 36.7 C 36.7 C  Resp: 16 14    Complications: No apparent anesthesia complications

## 2015-12-25 NOTE — Telephone Encounter (Signed)
i think he means feb

## 2015-12-25 NOTE — Op Note (Signed)
Preoperative diagnosis: Umbilical hernia.   Postoperative diagnosis: Same.  Operative procedure: Repair of umbilical hernia with Ventralex ST mesh.  Operating surgeon: Ollen Bowl, M.D.  Anesthesia: Gen. endotracheal, Marcaine 0.5%, plain, 30 mL local infiltration.  Assessment blood loss: None.  Clinical note: This 60 year old male has had an enlarging umbilical hernia. He underwent bilateral hydrocele repair earlier today and is now seen for elective hernia repair.  Operative note: The patient was under general anesthesia from his hydrocelectomy procedure. The abdomen was previously clipped of hair and now was prepped with ChloraPrep and draped. Marcaine was infiltrated in the area for postoperative analgesia. An infraumbilical incision was made and carried down to skin and subcutaneous tissue with the remaining dissection with electrocautery. The skin was elevated off the generous hernia sac. During dissection of the sac from the overlying skin and small cautery injury of the skin was made in this area was resected at time of wound closure. The hernia sac was freed from the overlying fascia circumferentially. A small rent in 2 locations was carefully closed with 3-0 Vicryl suture. A 6.4 cm Ventralex ST rash was put into the preperitoneal space. This was transfixed to the overlying fascia with interrupted 0 Surgilon sutures in 4 corners. The fascial defect which measured just over 3 cm was then closed with interrupted 0 Surgilon sutures taking a bite of the underlying mesh with each suture. These were then tied sequentially. Good approximation of the mesh to the overlying fascia was appreciated. The umbilical skin was trimmed and then tacked to the fascia with 3-0 Vicryl figure-of-eight suture. The adipose layer was approximated with a running 3-0 Vicryl suture and the skin closed with a running 4-Vicryl subcuticular suture. Benzoin Steri-Strips were applied followed by a Telfa and Tegaderm  dressing.  During extubation pressure was held at the hernia site while the patient experienced moderate coughing.  He was taken to recovery room in stable condition.

## 2015-12-25 NOTE — H&P (Signed)
No change in cardiopulmonary status. Flare of gout yesterday in left great toe. For umbilical hernia and bilateral hydrocele repair today.

## 2015-12-25 NOTE — Telephone Encounter (Signed)
-----   Message from Nickie Retort, MD sent at 12/25/2015 11:23 AM EST ----- Patient needs to see Larene Beach or Ria Comment on Monday January 6 for penrose drain removal. He needs to see me in on month for post surgical follow up

## 2015-12-25 NOTE — Anesthesia Preprocedure Evaluation (Addendum)
Anesthesia Evaluation  Patient identified by MRN, date of birth, ID band Patient awake    Reviewed: Allergy & Precautions, NPO status , Patient's Chart, lab work & pertinent test results  Airway Mallampati: III  TM Distance: >3 FB Neck ROM: Full    Dental  (+) Teeth Intact   Pulmonary    Pulmonary exam normal        Cardiovascular Exercise Tolerance: Good hypertension, + Peripheral Vascular Disease  Normal cardiovascular exam  Not on any meds for BP. Hx of varicose veins.   Neuro/Psych    GI/Hepatic   Endo/Other  Morbid obesity  Renal/GU      Musculoskeletal   Abdominal (+) + obese,  Abdomen: soft.    Peds  Hematology   Anesthesia Other Findings   Reproductive/Obstetrics                            Anesthesia Physical Anesthesia Plan  ASA: II  Anesthesia Plan: General   Post-op Pain Management:    Induction: Intravenous  Airway Management Planned: Oral ETT  Additional Equipment:   Intra-op Plan:   Post-operative Plan: Extubation in OR  Informed Consent: I have reviewed the patients History and Physical, chart, labs and discussed the procedure including the risks, benefits and alternatives for the proposed anesthesia with the patient or authorized representative who has indicated his/her understanding and acceptance.   Dental advisory given  Plan Discussed with: CRNA  Anesthesia Plan Comments:         Anesthesia Quick Evaluation

## 2015-12-25 NOTE — Transfer of Care (Signed)
Immediate Anesthesia Transfer of Care Note  Patient: Larry Benjamin.  Procedure(s) Performed: Procedure(s): HERNIA REPAIR UMBILICAL ADULT, WITH MESH (N/A) HYDROCELECTOMY  (Right)  Patient Location: PACU  Anesthesia Type:General  Level of Consciousness: sedated  Airway & Oxygen Therapy: Patient Spontanous Breathing and Patient connected to face mask oxygen  Post-op Assessment: Report given to RN and Post -op Vital signs reviewed and stable  Post vital signs: Reviewed and stable  Last Vitals:  Filed Vitals:   12/25/15 0830 12/25/15 1219  BP: 145/99 117/84  Pulse: 60 58  Temp: 36.7 C 36.7 C  Resp: 16 14    Complications: No apparent anesthesia complications

## 2015-12-25 NOTE — Telephone Encounter (Signed)
LM for patient to cb to give follow up appt info to  [facility]

## 2015-12-25 NOTE — Telephone Encounter (Signed)
Notified pt of appt with [facility] on 2/6 for drain removal. Pt voices understanding.

## 2015-12-25 NOTE — Anesthesia Procedure Notes (Signed)
Procedure Name: Intubation Date/Time: 12/25/2015 9:50 AM Performed by: Dionne Bucy Pre-anesthesia Checklist: Patient identified, Patient being monitored, Timeout performed, Emergency Drugs available and Suction available Patient Re-evaluated:Patient Re-evaluated prior to inductionOxygen Delivery Method: Circle system utilized Preoxygenation: Pre-oxygenation with 100% oxygen Intubation Type: IV induction Ventilation: Mask ventilation without difficulty Laryngoscope Size: McGraph and 4 Grade View: Grade I Tube type: Oral Tube size: 7.5 mm Number of attempts: 1 Airway Equipment and Method: Stylet Placement Confirmation: ETT inserted through vocal cords under direct vision,  positive ETCO2 and breath sounds checked- equal and bilateral Secured at: 23 cm Tube secured with: Tape Dental Injury: Teeth and Oropharynx as per pre-operative assessment

## 2015-12-25 NOTE — Anesthesia Postprocedure Evaluation (Signed)
Anesthesia Post Note  Patient: Larry Benjamin.  Procedure(s) Performed: Procedure(s) (LRB): HERNIA REPAIR UMBILICAL ADULT, WITH MESH (N/A) HYDROCELECTOMY  (Right)  Patient location during evaluation: PACU Anesthesia Type: General Level of consciousness: awake and alert Pain management: pain level controlled Vital Signs Assessment: post-procedure vital signs reviewed and stable Respiratory status: spontaneous breathing Cardiovascular status: blood pressure returned to baseline Postop Assessment: no headache Anesthetic complications: no    Last Vitals:  Filed Vitals:   12/25/15 1249 12/25/15 1301  BP: 118/75 129/85  Pulse: 58 54  Temp:  35.9 C  Resp: 16 16    Last Pain:  Filed Vitals:   12/25/15 1304  PainSc: 0-No pain                 Glena Pharris M

## 2015-12-28 ENCOUNTER — Ambulatory Visit (INDEPENDENT_AMBULATORY_CARE_PROVIDER_SITE_OTHER): Payer: No Typology Code available for payment source | Admitting: Urology

## 2015-12-28 ENCOUNTER — Encounter: Payer: Self-pay | Admitting: Urology

## 2015-12-28 VITALS — BP 147/83 | HR 91 | Temp 99.4°F | Ht 71.0 in | Wt 282.9 lb

## 2015-12-28 DIAGNOSIS — N433 Hydrocele, unspecified: Secondary | ICD-10-CM

## 2015-12-28 LAB — SURGICAL PATHOLOGY

## 2015-12-28 NOTE — Progress Notes (Signed)
12/28/2015 3:12 PM   Larry Bi Sr. [birth year] [id number]  Referring provider: Jerrol Banana., MD [address] [place], [place] [address]  Chief Complaint  Patient presents with  . Routine Post Op    remove penrose drain    HPI: Patient is a 60 year old Caucasian male who underwent right hydrocelectomy on 12/25/2015 with Dr. Pilar Jarvis who presents today for a Penrose drain removal.  He states his post-operative course has been uneventful.  He did have a low grade fever last evening and has had scrotal swelling.  He is currently on Keflex.  He has been wearing an athletic supporter, but he has been on his feet.    He has not noticed any significant drainage from the Penrose drain.  He has not had any drainage from the sutures.  He has not had chills, nausea or vomiting.     PMH: Past Medical History  Diagnosis Date  . History of kidney stones   . Gout   . Scrotal swelling   . Umbilical hernia   . Bilateral hydrocele   . Hypertension   . Varicose veins of lower extremities with other complications 0000000    Surgical History: Past Surgical History  Procedure Laterality Date  . Colonoscopy  2009, 2013    2009, tubulovillous adenoma the transverse colon, tubular adenoma at 30 cm.2013: Benign lymphoid mucosa at 25 cm.  . Knee surgery Right 2013    arthroscopy  . Umbilical hernia repair N/A 12/25/2015    Procedure: HERNIA REPAIR UMBILICAL ADULT, WITH MESH;  Surgeon: Robert Bellow, MD;  Location: [facility] ORS;  Service: General;  Laterality: N/A;  . Hydrocele excision Right 12/25/2015    Procedure: HYDROCELECTOMY ;  Surgeon: Nickie Retort, MD;  Location: [facility] ORS;  Service: Urology;  Laterality: Right;    Home Medications:    Medication List       This list is accurate as of: 12/28/15  3:12 PM.  Always use your most recent med list.               AZOR 5-40 MG tablet  Generic drug:  amLODipine-olmesartan  take 1 tablet by mouth once daily     cephALEXin 500 MG capsule  Commonly known as:  KEFLEX  Take 1 capsule (500 mg total) by mouth 3 (three) times daily.     fluticasone 50 MCG/ACT nasal spray  Commonly known as:  FLONASE  Place 1 spray into both nostrils daily.     HYDROcodone-acetaminophen 5-325 MG tablet  Commonly known as:  NORCO  Take 1-2 tablets by mouth every 4 (four) hours as needed for moderate pain.     ibuprofen 200 MG tablet  Commonly known as:  ADVIL,MOTRIN  Take 200 mg by mouth every 6 (six) hours as needed (3 tablets as needed.). Reported on 12/28/2015        Allergies:  Allergies  Allergen Reactions  . Dust Mite Extract   . Tree Extract     Family History: Family History  Problem Relation Age of Onset  . Colon cancer    . Colon polyps    . Stroke Father   . Heart attack Father 59  . Hyperlipidemia Mother   . Breast cancer Mother   . Kidney disease Neg Hx   . Prostate cancer Neg Hx     Social History:  reports that he has never smoked. He has never used smokeless tobacco. He reports that he drinks about 4.8 -  5.4 oz of alcohol per week. He reports that he does not use illicit drugs.  ROS: UROLOGY Frequent Urination?: No Hard to postpone urination?: No Burning/pain with urination?: No Get up at night to urinate?: No Leakage of urine?: No Urine stream starts and stops?: No Trouble starting stream?: No Do you have to strain to urinate?: No Blood in urine?: No Urinary tract infection?: No Sexually transmitted disease?: No Injury to kidneys or bladder?: No Painful intercourse?: No Weak stream?: No Erection problems?: No Penile pain?: No  Gastrointestinal Nausea?: Yes Vomiting?: No Indigestion/heartburn?: No Diarrhea?: No Constipation?: Yes  Constitutional Fever: Yes Night sweats?: No Weight loss?: No Fatigue?: No  Skin Skin rash/lesions?: No Itching?: No  Eyes Blurred vision?: No Double vision?: No  Ears/Nose/Throat Sore throat?: No Sinus problems?:  Yes  Hematologic/Lymphatic Swollen glands?: No Easy bruising?: No  Cardiovascular Leg swelling?: No Chest pain?: No  Respiratory Cough?: No Shortness of breath?: No  Endocrine Excessive thirst?: No  Musculoskeletal Back pain?: No Joint pain?: No  Neurological Headaches?: No Dizziness?: No  Psychologic Depression?: No Anxiety?: No  Physical Exam: BP 147/83 mmHg  Pulse 91  Temp(Src) 99.4 F (37.4 C) (Oral)  Ht 5\' 11"  (1.803 m)  Wt 282 lb 14.4 oz (128.323 kg)  BMI 39.47 kg/m2  Constitutional: Well nourished. Alert and oriented, No acute distress. HEENT: [name], moist mucus membranes. Trachea midline, no masses. Cardiovascular: No clubbing, cyanosis, or edema. Respiratory: Normal respiratory effort, no increased work of breathing. GI: Abdomen is soft, non tender, non distended, no abdominal masses. Liver and spleen not palpable.  No hernias appreciated.  Stool sample for occult testing is not indicated.   GU: No CVA tenderness.  No bladder fullness or masses.  Scrotum was edematous, but non tender.  No erythema was noted.  Sutures were clean and dry.  Penrose drain in place.  Skin: No rashes, bruises or suspicious lesions. Lymph: No cervical or inguinal adenopathy. Neurologic: Grossly intact, no focal deficits, moving all 4 extremities. Psychiatric: Normal mood and affect.  Laboratory Data: Lab Results  Component Value Date   WBC 4.2 11/26/2015   HGB 14.0 11/26/2015   HCT 41.8 11/26/2015   MCV 93.1 11/26/2015   PLT 182 11/26/2015    Lab Results  Component Value Date   CREATININE 0.68 11/26/2015    Lab Results  Component Value Date   PSA 0.3 07/24/2014    Lab Results  Component Value Date   TSH 1.750 09/04/2015    Lab Results  Component Value Date   AST 15 09/04/2015   Lab Results  Component Value Date   ALT 20 09/04/2015   Procedure Penrose drain was not draining any significant amount of fluid on palpating the scrotum.  The drain is  removed without difficulty.    Assessment & Plan:    1. Hydrocele:   Patient is s/p right hydrocelectomy on 12/25/2015.  The Penrose drain is removed without difficulty.  I have advised him to stay off his feet and elevated his scrotum with towels about his thighs while seated.  He will contact the office if the swelling had not improved by Friday.  He will contact the office if he has a high fever (102.0 F), chills, nausea or vomiting.  He is to follow up with Dr. Pilar Jarvis in one month.     Return in about 1 month (around 01/25/2016) for appointment with Dr. Pilar Jarvis.  These notes generated with voice recognition software. I apologize for typographical errors.  Ellawyn Wogan  [place], [facility] [address] [place], [place] [address] [phone]

## 2016-01-04 ENCOUNTER — Encounter: Payer: Self-pay | Admitting: Urology

## 2016-01-04 ENCOUNTER — Ambulatory Visit (INDEPENDENT_AMBULATORY_CARE_PROVIDER_SITE_OTHER): Payer: No Typology Code available for payment source | Admitting: Urology

## 2016-01-04 VITALS — BP 132/92 | HR 73 | Ht 71.0 in | Wt 281.9 lb

## 2016-01-04 DIAGNOSIS — N433 Hydrocele, unspecified: Secondary | ICD-10-CM

## 2016-01-04 NOTE — Progress Notes (Signed)
60 year old male who is status post right hydrocelectomy 10 days prior. He presents today for concern of infection and ongoing swelling. The patient denies any fevers or chills. He denies dysuria. He is not a drainage from the incision. His pain is improving.  Exam: The patient has an enlarged right hemiscrotum. There is no significant induration or erythema. His incision is clean dry and intact. There is no drainage from that area. The patient's pain is minimal.  Impression: The patient has ongoing swelling and edema from his right sided hydrocelectomy. Recommendations: I reassured the patient that given the size of his hydrocele there would be significant inflammation and swelling over the course of the next 6 weeks. I encouraged him to elevate his scrotum and continue to use ice. I also discussed with the patient return precautions including drainage, fever, chills, or progressive pain. The patient will keep his scheduled follow-up appointment.

## 2016-01-05 ENCOUNTER — Ambulatory Visit (INDEPENDENT_AMBULATORY_CARE_PROVIDER_SITE_OTHER): Payer: No Typology Code available for payment source | Admitting: General Surgery

## 2016-01-05 ENCOUNTER — Encounter: Payer: Self-pay | Admitting: General Surgery

## 2016-01-05 VITALS — BP 130/74 | HR 78 | Resp 16 | Ht 71.0 in | Wt 282.0 lb

## 2016-01-05 DIAGNOSIS — K429 Umbilical hernia without obstruction or gangrene: Secondary | ICD-10-CM

## 2016-01-05 NOTE — Patient Instructions (Addendum)
Patient to return as needed. Proper lifting techniques reviewed. 

## 2016-01-05 NOTE — Progress Notes (Signed)
Patient ID: Larry SELFE Sr., male   DOB: 1956/06/09, 60 y.o.   MRN: [id number]  Chief Complaint  Patient presents with  . Routine Post Op    umbilical hernia    HPI Larry ROSSANO Sr. is a 60 y.o. male here today for his post op umbilical hernia repair done on 12/25/15. Patient states he is doing well.  HPI  Past Medical History  Diagnosis Date  . History of kidney stones   . Gout   . Scrotal swelling   . Umbilical hernia   . Bilateral hydrocele   . Hypertension   . Varicose veins of lower extremities with other complications 0000000  . BP (high blood pressure) 09/02/2015  . 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 Adult BMI 30+ 09/02/2015  . HLD (hyperlipidemia) 09/02/2015  . Right flank pain 09/30/2015  . Umbilical hernia without obstruction and without gangrene 07/25/2014    Past Surgical History  Procedure Laterality Date  . Colonoscopy  2009, 2013    2009, tubulovillous adenoma the transverse colon, tubular adenoma at 30 cm.2013: Benign lymphoid mucosa at 25 cm.  . Knee surgery Right 2013    arthroscopy  . Umbilical hernia repair N/A 12/25/2015    Procedure: HERNIA REPAIR UMBILICAL ADULT, WITH MESH;  Surgeon: Robert Bellow, MD;  Location: [facility] ORS;  Service: General;  Laterality: N/A;  . Hydrocele excision Right 12/25/2015    Procedure: HYDROCELECTOMY ;  Surgeon: Nickie Retort, MD;  Location: [facility] ORS;  Service: Urology;  Laterality: Right;  . Hernia repair  [id number]    Umbilical hernia repair with 6.4 cm Ventralex ST mesh    Family History  Problem Relation Age of Onset  . Colon cancer    . Colon polyps    . Stroke Father   . Heart attack Father 50  . Hyperlipidemia Mother   . Breast cancer Mother   . Kidney disease Neg Hx   . Prostate cancer Neg Hx     Social History Social History  Substance Use Topics  . Smoking status: Never Smoker   . Smokeless tobacco: Never Used  . Alcohol Use: 4.8 - 5.4 oz/week    7 Glasses of wine, 1-2 Cans of beer per week     Allergies  Allergen Reactions  . Dust Mite Extract   . Tree Extract     Current Outpatient Prescriptions  Medication Sig Dispense Refill  . AZOR 5-40 MG tablet take 1 tablet by mouth once daily 30 tablet 5  . cephALEXin (KEFLEX) 500 MG capsule Take 1 capsule (500 mg total) by mouth 3 (three) times daily. 15 capsule 0  . colchicine 0.6 MG tablet Take 0.6 mg by mouth 2 (two) times daily.  0  . fluticasone (FLONASE) 50 MCG/ACT nasal spray Place 1 spray into both nostrils daily. Reported on 01/04/2016    . ibuprofen (ADVIL,MOTRIN) 200 MG tablet Take 200 mg by mouth every 6 (six) hours as needed (3 tablets as needed.). Reported on 01/04/2016    . HYDROcodone-acetaminophen (NORCO) 5-325 MG tablet Take 1-2 tablets by mouth every 4 (four) hours as needed for moderate pain. (Patient not taking: Reported on 12/28/2015) 30 tablet 0   No current facility-administered medications for this visit.    Review of Systems Review of Systems  Constitutional: Negative.   Respiratory: Negative.   Cardiovascular: Negative.     Blood pressure 130/74, pulse 78, resp. rate 16, height 5\' 11"  (1.803 m), weight 282 lb (127.914 kg).  Physical Exam  Physical Exam  Constitutional: He is oriented to person, place, and time. He appears well-developed and well-nourished.  Abdominal: Soft. Normal appearance.    Umbilical hernia repair is intact .   Genitourinary:     Neurological: He is alert and oriented to person, place, and time.  Skin: Skin is warm and dry.    Data Reviewed Pathology of the hydrocele sac was benign.  Assessment    Doing well status post umbilical hernia repair with mesh reinforcement.  Slight fluid reaccumulation of the left scrotum, evidence of resolving hematoma on the right. Asymptomatic.    Plan    Proper lifting techniques reviewed. The patient will notify the office if he has any concerns. Follow up with urology as previously scheduled.     PCP:  Rosanna Randy  This  information has been scribed by Gaspar Cola CMA.   Robert Bellow 01/05/2016, 1:14 PM

## 2016-01-22 ENCOUNTER — Ambulatory Visit: Payer: No Typology Code available for payment source

## 2016-01-28 ENCOUNTER — Ambulatory Visit (INDEPENDENT_AMBULATORY_CARE_PROVIDER_SITE_OTHER): Payer: No Typology Code available for payment source | Admitting: Urology

## 2016-01-28 VITALS — BP 147/88 | HR 72 | Ht 71.0 in | Wt 282.0 lb

## 2016-01-28 DIAGNOSIS — N432 Other hydrocele: Secondary | ICD-10-CM

## 2016-01-28 NOTE — Progress Notes (Signed)
01/28/2016 9:22 AM   Larry Bi Sr. 1956/05/21 [id number]  Referring provider: Jerrol Banana., MD [address] [place], [place] [address]  Chief Complaint  Patient presents with  . Hydrocele    5month follow up    HPI: The patient is a 60 year old male who follows up after right hydrocelectomy. He has done well since his procedure. His pain is controlled. His swelling is decreased her medically. He had no infections after his surgery.     PMH: Past Medical History  Diagnosis Date  . History of kidney stones   . Gout   . Scrotal swelling   . Umbilical hernia   . Bilateral hydrocele   . Hypertension   . Varicose veins of lower extremities with other complications 0000000  . BP (high blood pressure) 09/02/2015  . 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 Adult BMI 30+ 09/02/2015  . HLD (hyperlipidemia) 09/02/2015  . Right flank pain 09/30/2015  . Umbilical hernia without obstruction and without gangrene 07/25/2014    Surgical History: Past Surgical History  Procedure Laterality Date  . Colonoscopy  2009, 2013    2009, tubulovillous adenoma the transverse colon, tubular adenoma at 30 cm.2013: Benign lymphoid mucosa at 25 cm.  . Knee surgery Right 2013    arthroscopy  . Umbilical hernia repair N/A 12/25/2015    Procedure: HERNIA REPAIR UMBILICAL ADULT, WITH MESH;  Surgeon: Robert Bellow, MD;  Location: [facility] ORS;  Service: General;  Laterality: N/A;  . Hydrocele excision Right 12/25/2015    Procedure: HYDROCELECTOMY ;  Surgeon: Nickie Retort, MD;  Location: [facility] ORS;  Service: Urology;  Laterality: Right;  . Hernia repair  [id number]    Umbilical hernia repair with 6.4 cm Ventralex ST mesh    Home Medications:    Medication List       This list is accurate as of: 01/28/16  9:22 AM.  Always use your most recent med list.               AZOR 5-40 MG tablet  Generic drug:  amLODipine-olmesartan  take 1 tablet by mouth once daily     colchicine 0.6  MG tablet  Take 0.6 mg by mouth 2 (two) times daily.     fluticasone 50 MCG/ACT nasal spray  Commonly known as:  FLONASE  Place 1 spray into both nostrils daily. Reported on 01/04/2016     ibuprofen 200 MG tablet  Commonly known as:  ADVIL,MOTRIN  Take 200 mg by mouth every 6 (six) hours as needed (3 tablets as needed.). Reported on 01/04/2016        Allergies:  Allergies  Allergen Reactions  . Dust Mite Extract   . Tree Extract     Family History: Family History  Problem Relation Age of Onset  . Colon cancer    . Colon polyps    . Stroke Father   . Heart attack Father 30  . Hyperlipidemia Mother   . Breast cancer Mother   . Kidney disease Neg Hx   . Prostate cancer Neg Hx     Social History:  reports that he has never smoked. He has never used smokeless tobacco. He reports that he drinks about 4.8 - 5.4 oz of alcohol per week. He reports that he does not use illicit drugs.  ROS: UROLOGY Frequent Urination?: No Hard to postpone urination?: No Burning/pain with urination?: No Get up at night to urinate?: No Leakage of urine?: No Urine stream  starts and stops?: No Trouble starting stream?: No Do you have to strain to urinate?: No Blood in urine?: No Urinary tract infection?: No Sexually transmitted disease?: No Injury to kidneys or bladder?: No Painful intercourse?: No Weak stream?: No Erection problems?: No Penile pain?: No  Gastrointestinal Nausea?: No Vomiting?: No Indigestion/heartburn?: No Diarrhea?: No Constipation?: No  Constitutional Fever: No Night sweats?: No Weight loss?: No Fatigue?: No  Skin Skin rash/lesions?: No Itching?: No  Eyes Blurred vision?: No Double vision?: No  Ears/Nose/Throat Sore throat?: No Sinus problems?: No  Hematologic/Lymphatic Swollen glands?: No Easy bruising?: No  Cardiovascular Leg swelling?: No Chest pain?: No  Respiratory Cough?: No Shortness of breath?: No  Endocrine Excessive thirst?:  No  Musculoskeletal Back pain?: No Joint pain?: No  Neurological Headaches?: No Dizziness?: No  Psychologic Depression?: No Anxiety?: No  Physical Exam: BP 147/88 mmHg  Pulse 72  Ht 5\' 11"  (1.803 m)  Wt 282 lb (127.914 kg)  BMI 39.35 kg/m2  Constitutional:  Alert and oriented, No acute distress. HEENT: [place] AT, moist mucus membranes.  Trachea midline, no masses. Cardiovascular: No clubbing, cyanosis, or edema. Respiratory: Normal respiratory effort, no increased work of breathing. GI: Abdomen is soft, nontender, nondistended, no abdominal masses GU: No CVA tenderness. Right hydrocele safely decreased in size. He may have some residual hematoma that is still resolving. No sign of infection. Incision clean dry and intact. Overall is healing well. Skin: No rashes, bruises or suspicious lesions. Lymph: No cervical or inguinal adenopathy. Neurologic: Grossly intact, no focal deficits, moving all 4 extremities. Psychiatric: Normal mood and affect.  Laboratory Data: Lab Results  Component Value Date   WBC 4.2 11/26/2015   HGB 14.0 11/26/2015   HCT 41.8 11/26/2015   MCV 93.1 11/26/2015   PLT 182 11/26/2015    Lab Results  Component Value Date   CREATININE 0.68 11/26/2015    Lab Results  Component Value Date   PSA 0.3 07/24/2014    No results found for: TESTOSTERONE  No results found for: HGBA1C  Urinalysis    Component Value Date/Time   COLORURINE YELLOW* 11/26/2015 1030   APPEARANCEUR CLEAR* 11/26/2015 1030   LABSPEC 1.013 11/26/2015 1030   PHURINE 7.0 11/26/2015 1030   GLUCOSEU NEGATIVE 11/26/2015 1030   [place] 11/26/2015 1030   [place] 11/26/2015 1030   BILIRUBINUR negative 09/03/2015 1211   [place] 11/26/2015 [place] 11/26/2015 1030   PROTEINUR trace 09/03/2015 1211   UROBILINOGEN 0.2 09/03/2015 1211   NITRITE NEGATIVE 11/26/2015 1030   NITRITE negative 09/03/2015 1211   LEUKOCYTESUR NEGATIVE  11/26/2015 1030     Assessment & Plan:    1. Right hydrocele status post tonsillectomy The patient is healing well. He'll follow-up prn.   Return if symptoms worsen or fail to improve.  Nickie Retort, MD  [facility] [address] [place], [place] [address] [phone]

## 2016-02-24 ENCOUNTER — Encounter: Payer: Self-pay | Admitting: Family Medicine

## 2016-02-24 ENCOUNTER — Ambulatory Visit (INDEPENDENT_AMBULATORY_CARE_PROVIDER_SITE_OTHER): Payer: No Typology Code available for payment source | Admitting: Family Medicine

## 2016-02-24 VITALS — BP 138/78 | HR 84 | Temp 98.3°F | Resp 16 | Wt 281.0 lb

## 2016-02-24 DIAGNOSIS — I872 Venous insufficiency (chronic) (peripheral): Secondary | ICD-10-CM | POA: Diagnosis not present

## 2016-02-24 NOTE — Progress Notes (Signed)
Patient ID: Larry TENNENBAUM Sr., male   DOB: [birth year], 60 y.o.   MRN: [id number]       Patient: Larry Benjamin. Male    DOB: 1956/02/23   60 y.o.   MRN: [id number] Visit Date: 02/24/2016  Today's Provider: Wilhemena Durie, MD   Chief Complaint  Patient presents with  . Hypertension    6 month f/u.    Subjective:    HPI  Hypertension, follow-up:  BP Readings from Last 3 Encounters:  02/24/16 138/78  01/28/16 147/88  01/05/16 130/74    He was last seen for hypertension 6 months ago.  BP at that visit was 147/88. Management since that visit includes no changes. He reports good compliance with treatment. He is not having side effects.  He is not exercising. He is adherent to low salt diet.   Outside blood pressures are not being checked. Patient denies fatigue, irregular heart beat, palpitations and tachypnea.     Weight trend: stable Wt Readings from Last 3 Encounters:  02/24/16 281 lb (127.461 kg)  01/28/16 282 lb (127.914 kg)  01/05/16 282 lb (127.914 kg)    Current diet: well balanced  Patient also mentions that he has soreness in his lower legs. He reports that he has had this for over 3 weeks. Patient wears support hose that sometimes help his symptoms.        Allergies  Allergen Reactions  . Dust Mite Extract   . Tree Extract    Previous Medications   AZOR 5-40 MG TABLET    take 1 tablet by mouth once daily   COLCHICINE 0.6 MG TABLET    Take 0.6 mg by mouth 2 (two) times daily.   FLUTICASONE (FLONASE) 50 MCG/ACT NASAL SPRAY    Place 1 spray into both nostrils daily. Reported on 01/04/2016   IBUPROFEN (ADVIL,MOTRIN) 200 MG TABLET    Take 200 mg by mouth every 6 (six) hours as needed (3 tablets as needed.). Reported on 01/04/2016    Review of Systems  Constitutional: Negative.   HENT: Negative.   Respiratory: Negative.   Cardiovascular: Negative.   Endocrine: Negative.   Musculoskeletal: Negative.   Allergic/Immunologic: Negative.     Neurological: Negative.   Hematological: Negative.     Social History  Substance Use Topics  . Smoking status: Never Smoker   . Smokeless tobacco: Never Used  . Alcohol Use: 4.8 - 5.4 oz/week    7 Glasses of wine, 1-2 Cans of beer per week   Objective:   BP 138/78 mmHg  Pulse 84  Temp(Src) 98.3 F (36.8 C)  Resp 16  Wt 281 lb (127.461 kg)  Physical Exam  Constitutional: He is oriented to person, place, and time. He appears well-developed and well-nourished.  Cardiovascular: Normal rate, regular rhythm and normal heart sounds.   Pulmonary/Chest: Effort normal and breath sounds normal.  Musculoskeletal:  Trace edema in both legs.   Neurological: He is alert and oriented to person, place, and time.  Skin: Skin is warm and dry.        Assessment & Plan:     1. Venous insufficiency  - Ambulatory referral to Vascular Surgery 2. Obesity Dietary changes are discussed at length. 3. Hypertension Controlled with meds. Exercise discussed at length      Wilhemena Durie, MD  [facility]

## 2016-03-03 ENCOUNTER — Ambulatory Visit: Payer: No Typology Code available for payment source | Admitting: Family Medicine

## 2016-03-16 ENCOUNTER — Other Ambulatory Visit: Payer: Self-pay | Admitting: Family Medicine

## 2016-06-06 ENCOUNTER — Telehealth: Payer: Self-pay | Admitting: Emergency Medicine

## 2016-06-06 ENCOUNTER — Encounter: Payer: Self-pay | Admitting: Family Medicine

## 2016-06-06 ENCOUNTER — Ambulatory Visit (INDEPENDENT_AMBULATORY_CARE_PROVIDER_SITE_OTHER): Payer: No Typology Code available for payment source | Admitting: Family Medicine

## 2016-06-06 VITALS — BP 160/98 | HR 84 | Temp 98.1°F | Resp 16 | Wt 289.0 lb

## 2016-06-06 DIAGNOSIS — K219 Gastro-esophageal reflux disease without esophagitis: Secondary | ICD-10-CM

## 2016-06-06 DIAGNOSIS — Z7189 Other specified counseling: Secondary | ICD-10-CM

## 2016-06-06 DIAGNOSIS — R609 Edema, unspecified: Secondary | ICD-10-CM | POA: Diagnosis not present

## 2016-06-06 DIAGNOSIS — I10 Essential (primary) hypertension: Secondary | ICD-10-CM | POA: Diagnosis not present

## 2016-06-06 DIAGNOSIS — M25473 Effusion, unspecified ankle: Secondary | ICD-10-CM

## 2016-06-06 MED ORDER — MAGNESIUM OXIDE 400 MG PO CAPS: 1.0000 | ORAL_CAPSULE | Freq: Two times a day (BID) | ORAL | Status: DC

## 2016-06-06 NOTE — Telephone Encounter (Signed)
error 

## 2016-06-06 NOTE — Progress Notes (Signed)
       Patient: Larry Benjamin. Male    DOB: 1956-10-03   60 y.o.   MRN: [id number] Visit Date: 06/06/2016  Today's Provider: Wilhemena Durie, MD   Chief Complaint  Patient presents with  . Edema  . Gastroesophageal Reflux   Subjective:    HPI    Edema: Patient complains of edema. The location of the edema is lower leg(s) bilateral (left more so than right).  The edema has been moderate.  Onset of symptoms was noticed at the beach this last weekend, and has been intermittent. The edema is present after driving a long time, standing a long time while on the beach. Saw Dr. Lucky Cowboy for bilateral LE pain.  GERD: Paitent complains of heartburn. This has been associated with dysphagia and need to clear throat frequently.  He denies belching, chest pain, choking on food, cough, melena and midespigastric pain. Symptoms have been present for two episodes. Medical therapy in the past has included Pepto Bismol, with relief.    Allergies  Allergen Reactions  . Dust Mite Extract   . Tree Extract    Current Meds  Medication Sig  . amLODipine-olmesartan (AZOR) 5-40 MG tablet take 1 tablet by mouth once daily    Review of Systems  Constitutional: Positive for diaphoresis (with reflux episodes). Negative for fever, chills, activity change, appetite change, fatigue and unexpected weight change.  Eyes: Negative.   Cardiovascular: Positive for leg swelling. Negative for chest pain and palpitations.  Gastrointestinal:       Dysphagia is present  Endocrine: Negative.     Social History  Substance Use Topics  . Smoking status: Never Smoker   . Smokeless tobacco: Never Used  . Alcohol Use: 4.8 - 5.4 oz/week    7 Glasses of wine, 1-2 Cans of beer per week   Objective:   BP 160/98 mmHg  Pulse 84  Temp(Src) 98.1 F (36.7 C) (Oral)  Resp 16  Wt 289 lb (131.09 kg)  Physical Exam  Constitutional: He appears well-developed and well-nourished.  Significant truncal obesity.  HENT:  Head:  Normocephalic and atraumatic.  Right Ear: External ear normal.  Left Ear: External ear normal.  Nose: Nose normal.  Eyes: Conjunctivae are normal.  Neck: Neck supple.  Cardiovascular: Normal rate, regular rhythm and normal heart sounds.   Pulmonary/Chest: Effort normal and breath sounds normal. No respiratory distress.  Abdominal: Soft. There is no tenderness.  Musculoskeletal: He exhibits edema (trace BLE).  Skin: Skin is warm and dry.  Psychiatric: He has a normal mood and affect. His behavior is normal.        Assessment & Plan:     1. Ankle edema/Chronic venous stasis changes Advised pt this is most likely secondary to Amlodipine and heat. Not worrisome at this time. Continue to wear support hose.  2. Gastroesophageal reflux disease, esophagitis presence not specified Denies medication at this time. Sx relieved with Pepto Bismol. Advised pt to raise head of bed with bricks.  3. Essential hypertension Elevated today. Will continue medications. Will continue to monitor.  4. Encounter for cardiac risk counseling Will refer to Dr. Nehemiah Massed for this. - Ambulatory referral to Cardiology  5. Status post umbilical hernia repair 6. Status post drainage of hydrocele bilateral      Patient seen and examined by Miguel Aschoff, MD, and note scribed by Renaldo Fiddler, CMA.   Richard Cranford Mon, MD  [facility]

## 2016-08-25 ENCOUNTER — Encounter: Payer: No Typology Code available for payment source | Admitting: Family Medicine

## 2016-09-06 ENCOUNTER — Encounter: Payer: Self-pay | Admitting: Family Medicine

## 2016-09-06 ENCOUNTER — Ambulatory Visit (INDEPENDENT_AMBULATORY_CARE_PROVIDER_SITE_OTHER): Payer: No Typology Code available for payment source | Admitting: Family Medicine

## 2016-09-06 VITALS — BP 158/100 | HR 88 | Temp 98.4°F | Resp 16 | Ht 71.0 in | Wt 290.0 lb

## 2016-09-06 DIAGNOSIS — Z125 Encounter for screening for malignant neoplasm of prostate: Secondary | ICD-10-CM | POA: Diagnosis not present

## 2016-09-06 DIAGNOSIS — I10 Essential (primary) hypertension: Secondary | ICD-10-CM

## 2016-09-06 DIAGNOSIS — M109 Gout, unspecified: Secondary | ICD-10-CM

## 2016-09-06 DIAGNOSIS — Z1211 Encounter for screening for malignant neoplasm of colon: Secondary | ICD-10-CM

## 2016-09-06 DIAGNOSIS — Z0000 Encounter for general adult medical examination without abnormal findings: Secondary | ICD-10-CM

## 2016-09-06 DIAGNOSIS — E7800 Pure hypercholesterolemia, unspecified: Secondary | ICD-10-CM | POA: Diagnosis not present

## 2016-09-06 LAB — POCT URINALYSIS DIPSTICK
BILIRUBIN UA: NEGATIVE
Blood, UA: NEGATIVE
GLUCOSE UA: NEGATIVE
KETONES UA: NEGATIVE
LEUKOCYTES UA: NEGATIVE
Nitrite, UA: NEGATIVE
SPEC GRAV UA: 1.025
Urobilinogen, UA: 0.2
pH, UA: 6.5

## 2016-09-06 LAB — IFOBT (OCCULT BLOOD): IFOBT: NEGATIVE

## 2016-09-06 NOTE — Progress Notes (Signed)
Patient: Larry Schrom., Male    DOB: Oct 13, 1956, 60 y.o.   MRN: [id number] Visit Date: 09/06/2016  Today's Provider: Wilhemena Durie, MD   Chief Complaint  Patient presents with  . Annual Exam   Subjective:    Annual physical exam Larry BROGAN Sr. is a 60 y.o. male who presents today for health maintenance and complete physical. He feels well. He reports exercising none. He reports he is sleeping well.  ----------------------------------------------------------------- Last colonoscopy- 03/28/2012 Tdap- 09/17/2008  Review of Systems  Constitutional: Negative.   HENT: Negative.   Eyes: Negative.   Respiratory: Negative.   Cardiovascular: Negative.   Gastrointestinal: Negative.   Endocrine: Negative.   Genitourinary: Negative.   Musculoskeletal: Negative.   Skin: Negative.   Allergic/Immunologic: Negative.   Neurological: Negative.   Hematological: Negative.   Psychiatric/Behavioral: Negative.     Social History      He  reports that he has never smoked. His smokeless tobacco use includes Chew. He reports that he drinks about 4.8 - 5.4 oz of alcohol per week . He reports that he does not use drugs.       Social History   Social History  . Marital status: Married    Spouse name: N/A  . Number of children: 3  . Years of education: N/A   Occupational History  .  [employer]   Social History Main Topics  . Smoking status: Never Smoker  . Smokeless tobacco: Current User    Types: Chew  . Alcohol use 4.8 - 5.4 oz/week    7 Glasses of wine, 1 - 2 Cans of beer per week  . Drug use: No  . Sexual activity: Not on file   Other Topics Concern  . Not on file   Social History Narrative  . No narrative on file    Past Medical History:  Diagnosis Date  . Adult BMI 30+ 09/02/2015  . Bilateral hydrocele   . BP (high blood pressure) 09/02/2015  . Gout   . History of kidney stones   . HLD (hyperlipidemia) 09/02/2015  . Hydrocele of  testis 07/25/2014  . Hypertension   . Right flank pain 09/30/2015  . Scrotal swelling   . Umbilical hernia   . Umbilical hernia without obstruction and without gangrene 07/25/2014  . Varicose veins of lower extremities with other complications 0000000     Patient Active Problem List   Diagnosis Date Noted  . Ankle edema 06/06/2016  . Acid reflux 06/06/2016  . Encounter for cardiac risk counseling 06/06/2016  . Right flank pain 09/30/2015  . Allergic rhinitis 09/02/2015  . HLD (hyperlipidemia) 09/02/2015  . BP (high blood pressure) 09/02/2015  . Adult BMI 30+ 09/02/2015  . 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 Umbilical hernia without obstruction and without gangrene 07/25/2014    Past Surgical History:  Procedure Laterality Date  . COLONOSCOPY  2009, 2013   2009, tubulovillous adenoma the transverse colon, tubular adenoma at 30 cm.2013: Benign lymphoid mucosa at 25 cm.  Marland Kitchen HERNIA REPAIR  [id number]   Umbilical hernia repair with 6.4 cm Ventralex ST mesh  . HYDROCELE EXCISION Right 12/25/2015   Procedure: HYDROCELECTOMY ;  Surgeon: Nickie Retort, MD;  Location: [facility] ORS;  Service: Urology;  Laterality: Right;  . KNEE SURGERY Right 2013   arthroscopy  . UMBILICAL HERNIA REPAIR N/A 12/25/2015   Procedure: HERNIA REPAIR UMBILICAL ADULT, WITH MESH;  Surgeon: Robert Bellow, MD;  Location: [facility] ORS;  Service: General;  Laterality: N/A;    Family History        Family Status  Relation Status  . Father Deceased  . Mother Alive  . Sister Alive  .    Marland Kitchen Neg Hx         His family history includes Breast cancer in his mother; Heart attack (age of onset: 59) in his father; Hyperlipidemia in his mother; Stroke in his father.    Allergies  Allergen Reactions  . Dust Mite Extract   . Tree Extract     Current Meds  Medication Sig  . telmisartan (MICARDIS) 40 MG tablet Take 1 tablet by mouth daily.    Patient Care Team: Jerrol Banana., MD as PCP -  General (Family Medicine) Jerrol Banana., MD (Family Medicine) Robert Bellow, MD (General Surgery)     Objective:   Vitals: BP (!) 158/100 (BP Location: Left Arm, Cuff Size: Large)   Pulse 88   Temp 98.4 F (36.9 C) (Oral)   Resp 16   Ht 5\' 11"  (1.803 m)   Wt 290 lb (131.5 kg)   BMI 40.45 kg/m    Physical Exam  Constitutional: He is oriented to person, place, and time. He appears well-developed and well-nourished. No distress.  HENT:  Head: Normocephalic and atraumatic.  Right Ear: External ear normal.  Left Ear: External ear normal.  Nose: Nose normal.  Mouth/Throat: Oropharynx is clear and moist. No oropharyngeal exudate.  Eyes: Conjunctivae and EOM are normal. Pupils are equal, round, and reactive to light. Right eye exhibits no discharge. Left eye exhibits no discharge. No scleral icterus.  Neck: Normal range of motion. Neck supple. No JVD present. No tracheal deviation present. No thyromegaly present.  Cardiovascular: Normal rate, regular rhythm, normal heart sounds and intact distal pulses.  Exam reveals no gallop and no friction rub.   No murmur heard. Pulmonary/Chest: Effort normal and breath sounds normal. No stridor. No respiratory distress. He has no wheezes. He has no rales. He exhibits no tenderness.  Abdominal: Soft. Bowel sounds are normal. He exhibits no distension and no mass. There is no tenderness. There is no rebound and no guarding.  Genitourinary: Rectum normal, prostate normal and penis normal. Rectal exam shows guaiac negative stool.  Musculoskeletal: Normal range of motion. He exhibits no edema, tenderness or deformity.  Lymphadenopathy:    He has no cervical adenopathy.  Neurological: He is alert and oriented to person, place, and time. He has normal reflexes.  Skin: Skin is warm and dry. No rash noted. He is not diaphoretic. No erythema. No pallor.  Psychiatric: He has a normal mood and affect. His behavior is normal. Judgment and thought  content normal.     Depression Screen PHQ 2/9 Scores 09/06/2016 09/03/2015  PHQ - 2 Score 0 0      Assessment & Plan:     Routine Health Maintenance and Physical Exam  Exercise Activities and Dietary recommendations Goals    None      Immunization History  Administered Date(s) Administered  . Tdap 09/10/2008    Health Maintenance  Topic Date Due  . Hepatitis C Screening  12-03-1955  . HIV Screening  12/11/1970  . ZOSTAVAX  12/12/2015  . INFLUENZA VACCINE  06/21/2016  . TETANUS/TDAP  09/10/2018  . COLONOSCOPY  03/28/2022      Discussed health benefits of physical activity, and encouraged him to engage in regular  exercise appropriate for his age and condition.    -------------------------------------------------------------------- 1. Annual physical exam Stable. - POCT urinalysis dipstick Results for orders placed or performed in visit on 09/06/16  POCT urinalysis dipstick  Result Value Ref Range   Color, UA amber    Clarity, UA clear    Glucose, UA Negative    Bilirubin, UA Negative    Ketones, UA Negative    Spec Grav, UA 1.025    Blood, UA Negative    pH, UA 6.5    Protein, UA trace    Urobilinogen, UA 0.2    Nitrite, UA Negative    Leukocytes, UA Negative Negative  IFOBT POC (occult bld, rslt in office)  Result Value Ref Range   IFOBT Negative      2. Essential hypertension  FU pending results. - CBC with Differential/Platelet - Comprehensive metabolic panel  3. Pure hypercholesterolemia  FU pending results. - TSH - Lipid panel  4. Gout, unspecified cause, unspecified chronicity, unspecified site  FU pending results. - Uric acid  5. Prostate cancer screening  FU pending results. - PSA  6. Screening for colon cancer Guaiac negative. - IFOBT POC (occult bld, rslt in office)  7.Obesity 8.Venous Insufficiency  I have done the exam and reviewed the chart and it is accurate to the best of my knowledge. Miguel Aschoff  M.D. [facility]   Patient seen and examined by Miguel Aschoff, MD, and note scribed by Renaldo Fiddler, CMA.   Richard Cranford Mon, MD  [facility]

## 2016-09-14 ENCOUNTER — Telehealth: Payer: Self-pay

## 2016-09-14 LAB — CBC WITH DIFFERENTIAL/PLATELET
BASOS: 1 %
Basophils Absolute: 0 10*3/uL (ref 0.0–0.2)
EOS (ABSOLUTE): 0.2 10*3/uL (ref 0.0–0.4)
EOS: 6 %
HEMATOCRIT: 40.8 % (ref 37.5–51.0)
HEMOGLOBIN: 14 g/dL (ref 12.6–17.7)
IMMATURE GRANULOCYTES: 1 %
Immature Grans (Abs): 0 10*3/uL (ref 0.0–0.1)
LYMPHS ABS: 1.5 10*3/uL (ref 0.7–3.1)
Lymphs: 41 %
MCH: 31.3 pg (ref 26.6–33.0)
MCHC: 34.3 g/dL (ref 31.5–35.7)
MCV: 91 fL (ref 79–97)
MONOCYTES: 9 %
MONOS ABS: 0.3 10*3/uL (ref 0.1–0.9)
NEUTROS PCT: 42 %
Neutrophils Absolute: 1.6 10*3/uL (ref 1.4–7.0)
Platelets: 181 10*3/uL (ref 150–379)
RBC: 4.47 x10E6/uL (ref 4.14–5.80)
RDW: 12.6 % (ref 12.3–15.4)
WBC: 3.8 10*3/uL (ref 3.4–10.8)

## 2016-09-14 LAB — COMPREHENSIVE METABOLIC PANEL
A/G RATIO: 2 (ref 1.2–2.2)
ALBUMIN: 4.2 g/dL (ref 3.6–4.8)
ALT: 21 IU/L (ref 0–44)
AST: 17 IU/L (ref 0–40)
Alkaline Phosphatase: 59 IU/L (ref 39–117)
BUN / CREAT RATIO: 18 (ref 10–24)
BUN: 14 mg/dL (ref 8–27)
Bilirubin Total: 0.9 mg/dL (ref 0.0–1.2)
CALCIUM: 9.2 mg/dL (ref 8.6–10.2)
CO2: 27 mmol/L (ref 18–29)
CREATININE: 0.79 mg/dL (ref 0.76–1.27)
Chloride: 102 mmol/L (ref 96–106)
GFR, EST AFRICAN AMERICAN: 113 mL/min/{1.73_m2} (ref >59)
GFR, EST NON AFRICAN AMERICAN: 98 mL/min/{1.73_m2} (ref >59)
GLOBULIN, TOTAL: 2.1 g/dL (ref 1.5–4.5)
Glucose: 98 mg/dL (ref 65–99)
POTASSIUM: 4.3 mmol/L (ref 3.5–5.2)
SODIUM: 142 mmol/L (ref 134–144)
TOTAL PROTEIN: 6.3 g/dL (ref 6.0–8.5)

## 2016-09-14 LAB — LIPID PANEL
CHOL/HDL RATIO: 3.7 ratio (ref 0.0–5.0)
CHOLESTEROL TOTAL: 224 mg/dL — AB (ref 100–199)
HDL: 60 mg/dL (ref >39)
LDL CALC: 139 mg/dL — AB (ref 0–99)
Triglycerides: 123 mg/dL (ref 0–149)
VLDL CHOLESTEROL CAL: 25 mg/dL (ref 5–40)

## 2016-09-14 LAB — PSA: PROSTATE SPECIFIC AG, SERUM: 0.3 ng/mL (ref 0.0–4.0)

## 2016-09-14 LAB — TSH: TSH: 2.22 u[IU]/mL (ref 0.450–4.500)

## 2016-09-14 LAB — URIC ACID: URIC ACID: 7.6 mg/dL (ref 3.7–8.6)

## 2016-09-14 NOTE — Telephone Encounter (Signed)
LMTCB 09/14/2016  Thanks,   -Mickel Baas

## 2016-09-14 NOTE — Telephone Encounter (Signed)
Pt advised of lab work.  He states he would like a ferritin level added to his labs.  When I asked him why he says a friend told him he should have it checked regularly.  Please advise.  Thanks,   -Mickel Baas

## 2016-09-14 NOTE — Telephone Encounter (Signed)
-----   Message from Jerrol Banana., MD sent at 09/14/2016  9:04 AM EDT ----- Labs stable.

## 2016-09-14 NOTE — Telephone Encounter (Signed)
Pt advised.   Thanks,   -Laura  

## 2016-09-15 NOTE — Telephone Encounter (Signed)
Spoke with patient and advised him of below, he states he has been reading up on joint pain which he has and wanted to check this level. I will check with [facility] to see if they have the right specimen to check this lab if not then advised patient he will have to come back and have labs drawn for this-aa

## 2016-09-15 NOTE — Telephone Encounter (Signed)
Waiting to see if they can add the test, called patient but was not able to leave a message

## 2016-09-15 NOTE — Telephone Encounter (Signed)
Pt is requesting a call back.  CB#[phone]/MW

## 2016-09-15 NOTE — Telephone Encounter (Signed)
Pt advised-aa 

## 2016-09-15 NOTE — Telephone Encounter (Signed)
I can add it but probably not covered as not necessary ,

## 2016-09-15 NOTE — Telephone Encounter (Signed)
Pt called again.  Request a call back at [phone]

## 2016-09-15 NOTE — Telephone Encounter (Signed)
Pt called back to see if Ana had an update from Arnold. Please advise. Thanks TNP

## 2016-09-16 LAB — SPECIMEN STATUS REPORT

## 2016-09-16 LAB — FERRITIN: FERRITIN: 437 ng/mL — AB (ref 30–400)

## 2016-09-16 NOTE — Telephone Encounter (Signed)
Spoke with [facility] representative-Tenisha?. It looks like from the tubes they have that Ferritin should be added fine but there is no note from lab technician to know if there is a problem or not. Called patient to let him know but he does not have voicemail set up-aa

## 2016-09-16 NOTE — Telephone Encounter (Signed)
Pt advised-aa 

## 2016-09-20 ENCOUNTER — Telehealth: Payer: Self-pay

## 2016-09-20 DIAGNOSIS — R7989 Other specified abnormal findings of blood chemistry: Secondary | ICD-10-CM | POA: Insufficient documentation

## 2016-09-20 NOTE — Telephone Encounter (Signed)
Patient would like referral to Hematology, per dr Rosanna Randy ok-aa

## 2016-09-23 ENCOUNTER — Inpatient Hospital Stay
Payer: No Typology Code available for payment source | Attending: Hematology and Oncology | Admitting: Hematology and Oncology

## 2016-09-23 ENCOUNTER — Inpatient Hospital Stay: Payer: No Typology Code available for payment source

## 2016-09-23 ENCOUNTER — Encounter: Payer: Self-pay | Admitting: Hematology and Oncology

## 2016-09-23 VITALS — BP 184/107 | HR 68 | Temp 96.0°F | Resp 18 | Wt 290.6 lb

## 2016-09-23 DIAGNOSIS — K429 Umbilical hernia without obstruction or gangrene: Secondary | ICD-10-CM | POA: Diagnosis not present

## 2016-09-23 DIAGNOSIS — M79661 Pain in right lower leg: Secondary | ICD-10-CM | POA: Diagnosis not present

## 2016-09-23 DIAGNOSIS — Z803 Family history of malignant neoplasm of breast: Secondary | ICD-10-CM | POA: Insufficient documentation

## 2016-09-23 DIAGNOSIS — Z800 Family history of malignant neoplasm of digestive organs: Secondary | ICD-10-CM | POA: Insufficient documentation

## 2016-09-23 DIAGNOSIS — M109 Gout, unspecified: Secondary | ICD-10-CM | POA: Diagnosis not present

## 2016-09-23 DIAGNOSIS — R7989 Other specified abnormal findings of blood chemistry: Secondary | ICD-10-CM

## 2016-09-23 DIAGNOSIS — Z79899 Other long term (current) drug therapy: Secondary | ICD-10-CM | POA: Insufficient documentation

## 2016-09-23 DIAGNOSIS — E785 Hyperlipidemia, unspecified: Secondary | ICD-10-CM | POA: Diagnosis not present

## 2016-09-23 DIAGNOSIS — J310 Chronic rhinitis: Secondary | ICD-10-CM | POA: Diagnosis not present

## 2016-09-23 DIAGNOSIS — M2550 Pain in unspecified joint: Secondary | ICD-10-CM | POA: Insufficient documentation

## 2016-09-23 DIAGNOSIS — I10 Essential (primary) hypertension: Secondary | ICD-10-CM | POA: Insufficient documentation

## 2016-09-23 DIAGNOSIS — F17220 Nicotine dependence, chewing tobacco, uncomplicated: Secondary | ICD-10-CM | POA: Diagnosis not present

## 2016-09-23 DIAGNOSIS — Z87442 Personal history of urinary calculi: Secondary | ICD-10-CM | POA: Insufficient documentation

## 2016-09-23 DIAGNOSIS — M79662 Pain in left lower leg: Secondary | ICD-10-CM | POA: Insufficient documentation

## 2016-09-23 DIAGNOSIS — N433 Hydrocele, unspecified: Secondary | ICD-10-CM | POA: Diagnosis not present

## 2016-09-23 LAB — C-REACTIVE PROTEIN

## 2016-09-23 LAB — IRON AND TIBC
Iron: 142 ug/dL (ref 45–182)
Saturation Ratios: 35 % (ref 17.9–39.5)
TIBC: 403 ug/dL (ref 250–450)
UIBC: 261 ug/dL

## 2016-09-23 LAB — FERRITIN: FERRITIN: 269 ng/mL (ref 24–336)

## 2016-09-23 LAB — SEDIMENTATION RATE: Sed Rate: 5 mm/hr (ref 0–20)

## 2016-09-23 NOTE — Assessment & Plan Note (Signed)
He had significant hypertension today but he attributed that to mild anxiety he will continue current medical management. I recommend close follow-up with primary care doctor for medication adjustment.

## 2016-09-23 NOTE — Progress Notes (Signed)
[facility] NOTE  Patient Care Team: Jerrol Banana., MD as PCP - General (Family Medicine) Jerrol Banana., MD (Family Medicine) Robert Bellow, MD (General Surgery)  CHIEF COMPLAINTS/PURPOSE OF CONSULTATION:  Elevated ferritin level  HISTORY OF PRESENTING ILLNESS:  Larry Bi Sr. 60 y.o. male is here because of elevated ferritin level of 437 This patient have chronic joint pain and bilateral shin pain recently. He also have chronic rhinitis/sinus congestion. He was told by some friends to get ferritin level checked and it came back elevated. His joint pain has resolved recently but he continues to have persistent rhinitis, managed with over-the-counter allergy medicine There were no family history of hemochromatosis, liver disease, diabetes or heart failure  MEDICAL HISTORY:  Past Medical History:  Diagnosis Date  . Adult BMI 30+ 09/02/2015  . Bilateral hydrocele   . BP (high blood pressure) 09/02/2015  . Gout   . History of kidney stones   . HLD (hyperlipidemia) 09/02/2015  . 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 Hypertension   . Right flank pain 09/30/2015  . Scrotal swelling   . Umbilical hernia   . Umbilical hernia without obstruction and without gangrene 07/25/2014  . Varicose veins of lower extremities with other complications 0000000    SURGICAL HISTORY: Past Surgical History:  Procedure Laterality Date  . COLONOSCOPY  2009, 2013   2009, tubulovillous adenoma the transverse colon, tubular adenoma at 30 cm.2013: Benign lymphoid mucosa at 25 cm.  Marland Kitchen HERNIA REPAIR  [id number]   Umbilical hernia repair with 6.4 cm Ventralex ST mesh  . HYDROCELE EXCISION Right 12/25/2015   Procedure: HYDROCELECTOMY ;  Surgeon: Nickie Retort, MD;  Location: [facility] OR[facility];  Service: Urology;  Laterality: Right;  . KNEE SURGERY Right 2013   arthroscopy  . UMBILICAL HERNIA REPAIR N/A 12/25/2015   Procedure: HERNIA REPAIR UMBILICAL ADULT, WITH MESH;  Surgeon:  Robert Bellow, MD;  Location: [facility] OR[facility];  Service: General;  Laterality: N/A;    SOCIAL HISTORY: Social History   Social History  . Marital status: Married    Spouse name: N/A  . Number of children: 3  . Years of education: N/A   Occupational History  .  [employer]   Social History Main Topics  . Smoking status: Never Smoker  . Smokeless tobacco: Current User    Types: Chew  . Alcohol use 4.8 - 5.4 oz/week    7 Glasses of wine, 1 - 2 Cans of beer per week  . Drug use: No  . Sexual activity: Not on file   Other Topics Concern  . Not on file   Social History Narrative  . No narrative on file    FAMILY HISTORY: Family History  Problem Relation Age of Onset  . Stroke Father   . Heart attack Father 66  . Hyperlipidemia Mother   . Breast cancer Mother   . Colon cancer    . Colon polyps    . Kidney disease Neg Hx   . Prostate cancer Neg Hx     ALLERGIES:  is allergic to dust mite extract and tree extract.  MEDICATIONS:  Current Outpatient Prescriptions  Medication Sig Dispense Refill  . telmisartan (MICARDIS) 40 MG tablet Take 1 tablet by mouth daily.     No current facility-administered medications for this visit.     REVIEW OF SYSTEMS:   Constitutional: Denies fevers, chills or abnormal night sweats Eyes: Denies blurriness of vision,  double vision or watery eyes Ears, nose, mouth, throat, and face: Denies mucositis or sore throat Respiratory: Denies cough, dyspnea or wheezes Cardiovascular: Denies palpitation, chest discomfort or lower extremity swelling Gastrointestinal:  Denies nausea, heartburn or change in bowel habits Skin: Denies abnormal skin rashes Lymphatics: Denies new lymphadenopathy or easy bruising Neurological:Denies numbness, tingling or new weaknesses Behavioral/Psych: Mood is stable, no new changes  All other systems were reviewed with the patient and are negative.  PHYSICAL EXAMINATION: ECOG PERFORMANCE STATUS: 0 -  Asymptomatic  Vitals:   09/23/16 1132  BP: (!) 184/107  Pulse: 68  Resp: 18  Temp: (!) 96 F (35.6 C)   Filed Weights   09/23/16 1132  Weight: 290 lb 9.1 oz (131.8 kg)    GENERAL:alert, no distress and comfortable. He is morbidly obese SKIN: skin color, texture, turgor are normal, no rashes or significant lesions EYES: normal, conjunctiva are pink and non-injected, sclera clear OROPHARYNX:no exudate, no erythema and lips, buccal mucosa, and tongue normal  NECK: supple, thyroid normal size, non-tender, without nodularity LYMPH:  no palpable lymphadenopathy in the cervical, axillary or inguinal LUNGS: clear to auscultation and percussion with normal breathing effort HEART: regular rate & rhythm and no murmurs and no lower extremity edema ABDOMEN:abdomen soft, non-tender and normal bowel sounds Musculoskeletal:no cyanosis of digits and no clubbing  PSYCH: alert & oriented x 3 with fluent speech NEURO: no focal motor/sensory deficits  LABORATORY DATA:  I have reviewed the data as listed Lab Results  Component Value Date   WBC 3.8 09/13/2016   HGB 14.0 11/26/2015   HCT 40.8 09/13/2016   MCV 91 09/13/2016   PLT 181 09/13/2016    Recent Labs  11/26/15 1030 09/13/16 1047  NA 140 142  K 3.9 4.3  CL 104 102  CO2 29 27  GLUCOSE 101* 98  BUN 12 14  CREATININE 0.68 0.79  CALCIUM 8.7* 9.2  GFRNONAA >60 98  GFRAA >60 113  PROT  --  6.3  ALBUMIN  --  4.2  AST  --  17  ALT  --  21  ALKPHOS  --  59  BILITOT  --  0.9    ASSESSMENT & PLAN:  Elevated ferritin The elevated ferritin level is nonspecific. It could be triggered by his chronic inflammatory rhinitis or joint inflammation Less likely would be iron overload syndromes such as hemochromatosis His last liver panel were within normal limits. I will order additional workup for evaluation and I will call him with test results. If the elevated ferritin is reactive in nature, he does not need long-term follow-up. I  addressed all his questions and concerns  Essential hypertension He had significant hypertension today but he attributed that to mild anxiety he will continue current medical management. I recommend close follow-up with primary care doctor for medication adjustment.   Orders Placed This Encounter  Procedures  . Ferritin    Standing Status:   Future    Number of Occurrences:   1    Standing Expiration Date:   10/28/2017  . Iron and TIBC    Standing Status:   Future    Number of Occurrences:   1    Standing Expiration Date:   10/28/2017  . Sedimentation rate    Standing Status:   Future    Number of Occurrences:   1    Standing Expiration Date:   10/28/2017  . C-reactive protein    Standing Status:   Future    Number of  Occurrences:   1    Standing Expiration Date:   10/28/2017     All questions were answered. The patient knows to call the clinic with any problems, questions or concerns. I spent 30 minutes counseling the patient face to face. The total time spent in the appointment was 40 minutes and more than 50% was on counseling.     Heath Lark, MD 09/23/2016 1:49 PM

## 2016-09-23 NOTE — Assessment & Plan Note (Signed)
The elevated ferritin level is nonspecific. It could be triggered by his chronic inflammatory rhinitis or joint inflammation Less likely would be iron overload syndromes such as hemochromatosis His last liver panel were within normal limits. I will order additional workup for evaluation and I will call him with test results. If the elevated ferritin is reactive in nature, he does not need long-term follow-up. I addressed all his questions and concerns

## 2016-09-28 ENCOUNTER — Telehealth: Payer: Self-pay | Admitting: Hematology and Oncology

## 2016-09-28 NOTE — Telephone Encounter (Signed)
I reviewed her recent blood test with the patient. Repeat iron studies, inflammatory markers and ferritin were within normal limits. I suspect the recent elevated ferritin was related to some form of acute inflammation that has subsequently resolved I addressed all his questions. He does not need future follow-up

## 2018-01-31 ENCOUNTER — Telehealth: Payer: Self-pay

## 2018-01-31 DIAGNOSIS — I10 Essential (primary) hypertension: Secondary | ICD-10-CM

## 2018-01-31 DIAGNOSIS — E782 Mixed hyperlipidemia: Secondary | ICD-10-CM

## 2018-01-31 NOTE — Telephone Encounter (Signed)
-----   Message from Osvaldo Shipper, [place] sent at 01/31/2018 11:09 AM EDT ----- Regarding: CT CA Score order Pam Dr Nehemiah Massed would like for [name] to have a CT CA Score.  MRN [id number] Name Larry Benjamin DOB 1956/09/30  DX Hyperlipidemia E78.2 Benign essential HTN HTN [l10]  Thank you  Erline Levine

## 2018-02-08 ENCOUNTER — Ambulatory Visit (INDEPENDENT_AMBULATORY_CARE_PROVIDER_SITE_OTHER)
Admission: RE | Admit: 2018-02-08 | Discharge: 2018-02-08 | Disposition: A | Discharge status: Home / Self Care | Payer: Self-pay | Source: Ambulatory Visit | Attending: Cardiovascular Disease | Admitting: Cardiovascular Disease

## 2018-02-08 DIAGNOSIS — E782 Mixed hyperlipidemia: Secondary | ICD-10-CM

## 2018-02-08 DIAGNOSIS — I10 Essential (primary) hypertension: Secondary | ICD-10-CM

## 2019-06-05 ENCOUNTER — Telehealth: Payer: Self-pay | Admitting: Family Medicine

## 2019-06-05 DIAGNOSIS — Z20828 Contact with and (suspected) exposure to other viral communicable diseases: Secondary | ICD-10-CM

## 2019-06-05 DIAGNOSIS — Z20822 Contact with and (suspected) exposure to covid-19: Secondary | ICD-10-CM

## 2019-06-05 NOTE — Telephone Encounter (Signed)
Ok to test--he also needs appt in 2020--not seen since 2017.

## 2019-06-05 NOTE — Telephone Encounter (Signed)
Order placed. Advised wife that patient needs to schedule appt.

## 2019-06-05 NOTE — Telephone Encounter (Signed)
For pt--not employee?

## 2019-06-05 NOTE — Telephone Encounter (Signed)
Please review. Thanks!  

## 2019-06-05 NOTE — Telephone Encounter (Signed)
Pt had employee who tested positive yesterday for Covid and now he needs to be tested in order to continue to go back to work.  He is not having any symptoms yet.  Can we put an order in for him to be tested?  CB#  [phone]  Thanks Con Memos

## 2019-06-05 NOTE — Telephone Encounter (Signed)
Called to clarify, and yes for the patient not employee. Thanks!

## 2019-06-10 ENCOUNTER — Telehealth: Payer: Self-pay | Admitting: Family Medicine

## 2019-06-10 NOTE — Telephone Encounter (Signed)
Pt had a test for Covid on Tuesday and has not heard anything back.  He has no symptoms.  CB#  [phone]  teri

## 2019-06-10 NOTE — Telephone Encounter (Signed)
Please review. Thanks!  

## 2019-06-11 NOTE — Telephone Encounter (Signed)
It is still not back.

## 2019-06-12 LAB — NOVEL CORONAVIRUS, NAA: SARS-CoV-2, NAA: NOT DETECTED

## 2019-06-12 NOTE — Telephone Encounter (Signed)
LMTCB    Results are negative, when patient calls back send message to let Dg G know he has been notified and he can sign off

## 2019-06-12 NOTE — Telephone Encounter (Signed)
-----   Message from Jerrol Banana., MD sent at 06/12/2019  8:04 AM EDT ----- No covid.

## 2019-06-14 NOTE — Telephone Encounter (Signed)
Patient advised of results. Patient requested a copy. Printed and placed up front ready for pick up.

## 2020-12-30 DIAGNOSIS — M170 Bilateral primary osteoarthritis of knee: Secondary | ICD-10-CM | POA: Diagnosis not present

## 2021-01-11 DIAGNOSIS — E782 Mixed hyperlipidemia: Secondary | ICD-10-CM | POA: Diagnosis not present

## 2021-01-11 DIAGNOSIS — I2510 Atherosclerotic heart disease of native coronary artery without angina pectoris: Secondary | ICD-10-CM | POA: Diagnosis not present

## 2021-01-11 DIAGNOSIS — I10 Essential (primary) hypertension: Secondary | ICD-10-CM | POA: Diagnosis not present

## 2021-01-11 DIAGNOSIS — I712 Thoracic aortic aneurysm, without rupture: Secondary | ICD-10-CM | POA: Diagnosis not present

## 2021-01-12 ENCOUNTER — Other Ambulatory Visit: Payer: Self-pay | Admitting: Cardiology

## 2021-01-12 DIAGNOSIS — I712 Thoracic aortic aneurysm, without rupture, unspecified: Secondary | ICD-10-CM

## 2021-01-26 ENCOUNTER — Other Ambulatory Visit: Payer: Self-pay

## 2021-01-26 ENCOUNTER — Ambulatory Visit
Admission: RE | Admit: 2021-01-26 | Discharge: 2021-01-26 | Disposition: A | Discharge status: Home / Self Care | Payer: Medicare Other | Source: Ambulatory Visit | Attending: Cardiology | Admitting: Cardiology

## 2021-01-26 DIAGNOSIS — N281 Cyst of kidney, acquired: Secondary | ICD-10-CM | POA: Diagnosis not present

## 2021-01-26 DIAGNOSIS — I517 Cardiomegaly: Secondary | ICD-10-CM | POA: Diagnosis not present

## 2021-01-26 DIAGNOSIS — I712 Thoracic aortic aneurysm, without rupture, unspecified: Secondary | ICD-10-CM

## 2021-01-26 DIAGNOSIS — J8410 Pulmonary fibrosis, unspecified: Secondary | ICD-10-CM | POA: Diagnosis not present

## 2021-01-26 LAB — POCT I-STAT CREATININE: Creatinine, Ser: 0.7 mg/dL (ref 0.61–1.24)

## 2021-01-26 MED ORDER — IOHEXOL 350 MG/ML SOLN
85.0000 mL | Freq: Once | INTRAVENOUS | Status: AC | PRN
  Administered 2021-01-26: 85 mL via INTRAVENOUS

## 2021-02-22 DIAGNOSIS — E782 Mixed hyperlipidemia: Secondary | ICD-10-CM | POA: Diagnosis not present

## 2021-02-22 DIAGNOSIS — I2510 Atherosclerotic heart disease of native coronary artery without angina pectoris: Secondary | ICD-10-CM | POA: Diagnosis not present

## 2021-02-22 DIAGNOSIS — R600 Localized edema: Secondary | ICD-10-CM | POA: Diagnosis not present

## 2021-02-22 DIAGNOSIS — I712 Thoracic aortic aneurysm, without rupture: Secondary | ICD-10-CM | POA: Diagnosis not present

## 2021-02-22 DIAGNOSIS — I10 Essential (primary) hypertension: Secondary | ICD-10-CM | POA: Diagnosis not present

## 2021-02-22 DIAGNOSIS — I700 Atherosclerosis of aorta: Secondary | ICD-10-CM | POA: Diagnosis not present

## 2021-03-04 DIAGNOSIS — M7712 Lateral epicondylitis, left elbow: Secondary | ICD-10-CM | POA: Diagnosis not present

## 2021-05-17 DIAGNOSIS — L814 Other melanin hyperpigmentation: Secondary | ICD-10-CM | POA: Diagnosis not present

## 2021-05-17 DIAGNOSIS — D485 Neoplasm of uncertain behavior of skin: Secondary | ICD-10-CM | POA: Diagnosis not present

## 2021-05-17 DIAGNOSIS — B354 Tinea corporis: Secondary | ICD-10-CM | POA: Diagnosis not present

## 2021-05-17 DIAGNOSIS — D0461 Carcinoma in situ of skin of right upper limb, including shoulder: Secondary | ICD-10-CM | POA: Diagnosis not present

## 2021-05-17 DIAGNOSIS — L820 Inflamed seborrheic keratosis: Secondary | ICD-10-CM | POA: Diagnosis not present

## 2021-06-24 DIAGNOSIS — D049 Carcinoma in situ of skin, unspecified: Secondary | ICD-10-CM | POA: Diagnosis not present

## 2021-06-24 DIAGNOSIS — L308 Other specified dermatitis: Secondary | ICD-10-CM | POA: Diagnosis not present

## 2021-06-24 DIAGNOSIS — L111 Transient acantholytic dermatosis [Grover]: Secondary | ICD-10-CM | POA: Diagnosis not present

## 2021-06-24 DIAGNOSIS — R21 Rash and other nonspecific skin eruption: Secondary | ICD-10-CM | POA: Diagnosis not present

## 2021-06-24 DIAGNOSIS — L820 Inflamed seborrheic keratosis: Secondary | ICD-10-CM | POA: Diagnosis not present

## 2021-08-24 DIAGNOSIS — E782 Mixed hyperlipidemia: Secondary | ICD-10-CM | POA: Diagnosis not present

## 2021-08-24 DIAGNOSIS — I2510 Atherosclerotic heart disease of native coronary artery without angina pectoris: Secondary | ICD-10-CM | POA: Diagnosis not present

## 2021-08-24 DIAGNOSIS — I10 Essential (primary) hypertension: Secondary | ICD-10-CM | POA: Diagnosis not present

## 2021-08-24 DIAGNOSIS — I700 Atherosclerosis of aorta: Secondary | ICD-10-CM | POA: Diagnosis not present

## 2021-08-24 DIAGNOSIS — I7121 Aneurysm of the ascending aorta, without rupture: Secondary | ICD-10-CM | POA: Diagnosis not present

## 2021-12-07 ENCOUNTER — Ambulatory Visit: Payer: Self-pay | Admitting: *Deleted

## 2021-12-07 NOTE — Telephone Encounter (Signed)
Summary: Positive Covid requesting medication   Patient called I stated that he was diagnosed with Covid on Monday morning at around [facility] when he took an at home test. Per patient his symptoms are mild had low grade fever of about 99 degrees  but would like to be prescribed some medication to help him feel better. Please call  Ph# [phone]     Attempted to call patient- no  answer and voice mail not set up. Try call again later recording.

## 2021-12-07 NOTE — Telephone Encounter (Signed)
° ° °  Chief Complaint: COVID 19 positive - home test, Monday Symptoms: Chills, temp. 99.9, mild cough Frequency: Started Sunday Pertinent Negatives: Patient denies shortness of breath.  Disposition: []ED /[]Urgent Care (no appt availability in office) / []Appointment(In office/virtual)/ [] [facility]/ []Home Care/ []Refused Recommended Disposition /[][facility]/ [] Follow-up with PCP Additional Notes: Asking for medication. Given [facility] outpatient pharmacy phone number for anti-viral medication.   Answer Assessment - Initial Assessment Questions 1. COVID-19 DIAGNOSIS: "Who made your COVID-19 diagnosis?" "Was it confirmed by a positive lab test or self-test?" If not diagnosed by a doctor (or NP/PA), ask "Are there lots of cases (community spread) where you live?" Note: See public health department website, if unsure.     Monday - home test 2. COVID-19 EXPOSURE: "Was there any known exposure to COVID before the symptoms began?" CDC Definition of close contact: within 6 feet (2 meters) for a total of 15 minutes or more over a 24-hour period.      No 3. ONSET: "When did the COVID-19 symptoms start?"      Sunday 4. WORST SYMPTOM: "What is your worst symptom?" (e.g., cough, fever, shortness of breath, muscle aches)     Chills - fever 99.9 5. COUGH: "Do you have a cough?" If Yes, ask: "How bad is the cough?"       Mild 6. FEVER: "Do you have a fever?" If Yes, ask: "What is your temperature, how was it measured, and when did it start?"     99 .9 7. RESPIRATORY STATUS: "Describe your breathing?" (e.g., shortness of breath, wheezing, unable to speak)      No 8. BETTER-SAME-WORSE: "Are you getting better, staying the same or getting worse compared to yesterday?"  If getting worse, ask, "In what way?"     Better 9. HIGH RISK DISEASE: "Do you have any chronic medical problems?" (e.g., asthma, heart or lung disease, weak immune system, obesity, etc.)     No 10. VACCINE: "Have you  had the COVID-19 vaccine?" If Yes, ask: "Which one, how many shots, when did you get it?"       Yes 11. BOOSTER: "Have you received your COVID-19 booster?" If Yes, ask: "Which one and when did you get it?"       Yes 12. PREGNANCY: "Is there any chance you are pregnant?" "When was your last menstrual period?"       N/a 13. OTHER SYMPTOMS: "Do you have any other symptoms?"  (e.g., chills, fatigue, headache, loss of smell or taste, muscle pain, sore throat)       Chills 14. O2 SATURATION MONITOR:  "Do you use an oxygen saturation monitor (pulse oximeter) at home?" If Yes, ask "What is your reading (oxygen level) today?" "What is your usual oxygen saturation reading?" (e.g., 95%)       No  Protocols used: Coronavirus (COVID-19) Diagnosed or Suspected-A-AH

## 2021-12-07 NOTE — Telephone Encounter (Signed)
2nd attempt, pt called, sounded like someone answered and I said hello multiple times but no response, called back again and no answer.

## 2022-02-24 DIAGNOSIS — I700 Atherosclerosis of aorta: Secondary | ICD-10-CM | POA: Diagnosis not present

## 2022-02-24 DIAGNOSIS — I10 Essential (primary) hypertension: Secondary | ICD-10-CM | POA: Diagnosis not present

## 2022-02-24 DIAGNOSIS — I2510 Atherosclerotic heart disease of native coronary artery without angina pectoris: Secondary | ICD-10-CM | POA: Diagnosis not present

## 2022-02-24 DIAGNOSIS — I7121 Aneurysm of the ascending aorta, without rupture: Secondary | ICD-10-CM | POA: Diagnosis not present

## 2022-02-24 DIAGNOSIS — E782 Mixed hyperlipidemia: Secondary | ICD-10-CM | POA: Diagnosis not present

## 2022-03-01 DIAGNOSIS — E782 Mixed hyperlipidemia: Secondary | ICD-10-CM | POA: Diagnosis not present

## 2022-03-02 ENCOUNTER — Other Ambulatory Visit: Payer: Self-pay | Admitting: Student

## 2022-03-02 DIAGNOSIS — I7121 Aneurysm of the ascending aorta, without rupture: Secondary | ICD-10-CM

## 2022-03-03 ENCOUNTER — Ambulatory Visit
Admission: RE | Admit: 2022-03-03 | Discharge: 2022-03-03 | Disposition: A | Discharge status: Home / Self Care | Payer: Medicare Other | Source: Ambulatory Visit | Attending: Student | Admitting: Student

## 2022-03-03 DIAGNOSIS — I7121 Aneurysm of the ascending aorta, without rupture: Secondary | ICD-10-CM | POA: Insufficient documentation

## 2022-03-03 LAB — POCT I-STAT CREATININE: Creatinine, Ser: 0.8 mg/dL (ref 0.61–1.24)

## 2022-03-03 MED ORDER — IOHEXOL 350 MG/ML SOLN
75.0000 mL | Freq: Once | INTRAVENOUS | Status: AC | PRN
  Administered 2022-03-03: 75 mL via INTRAVENOUS

## 2022-04-19 DIAGNOSIS — Z86010 Personal history of colonic polyps: Secondary | ICD-10-CM | POA: Diagnosis not present

## 2022-06-10 DIAGNOSIS — K5730 Diverticulosis of large intestine without perforation or abscess without bleeding: Secondary | ICD-10-CM | POA: Diagnosis not present

## 2022-06-10 DIAGNOSIS — Z1211 Encounter for screening for malignant neoplasm of colon: Secondary | ICD-10-CM | POA: Diagnosis not present

## 2022-06-10 DIAGNOSIS — Z86010 Personal history of colonic polyps: Secondary | ICD-10-CM | POA: Diagnosis not present

## 2022-06-10 DIAGNOSIS — D126 Benign neoplasm of colon, unspecified: Secondary | ICD-10-CM | POA: Diagnosis not present

## 2022-06-10 DIAGNOSIS — D124 Benign neoplasm of descending colon: Secondary | ICD-10-CM | POA: Diagnosis not present

## 2022-06-10 DIAGNOSIS — D120 Benign neoplasm of cecum: Secondary | ICD-10-CM | POA: Diagnosis not present

## 2022-09-04 IMAGING — CT CT ANGIO CHEST
2 of 7 series · 13 of 37 positions shown · IV contrast (omnipaque)
Comparison: 02/08/2018

CLINICAL DATA: 65-year-old male with history of ascending thoracic
aortic aneurysm. Follow-up study.

EXAM:
CT ANGIOGRAPHY CHEST WITH CONTRAST
TECHNIQUE: Multidetector CT imaging of the chest was performed using the
standard protocol during bolus administration of intravenous
contrast. Multiplanar CT image reconstructions and MIPs were
obtained to evaluate the vascular anatomy.
CONTRAST:  Eighty-five mL Omnipaque 350, intravenous

[Series 4: axial arterial cta thorax 2.00 · axial · arterial · 0.75mm/px · z∈[-1226,-938]mm · 12 of 172 slices shown]
[im 14/172  lung]
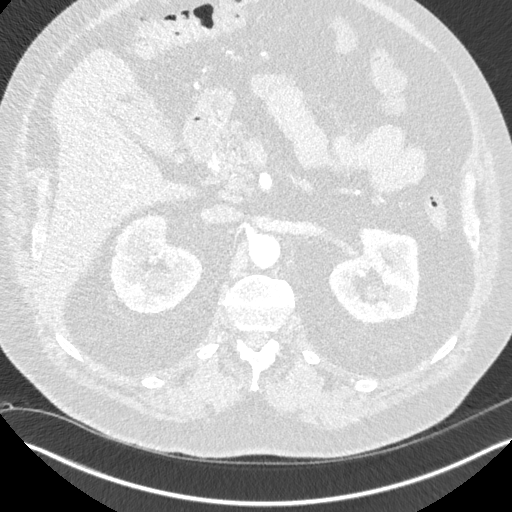
[im 27/172  mediastinal]
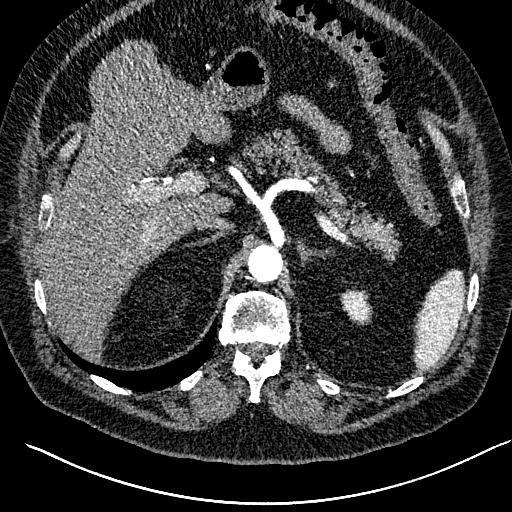
[im 40/172  lung]
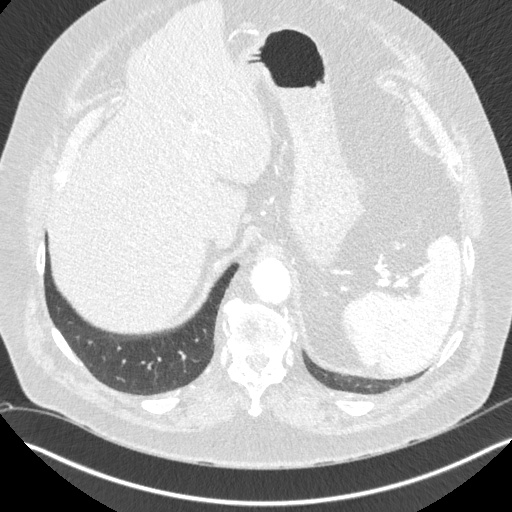
[im 53/172  mediastinal]
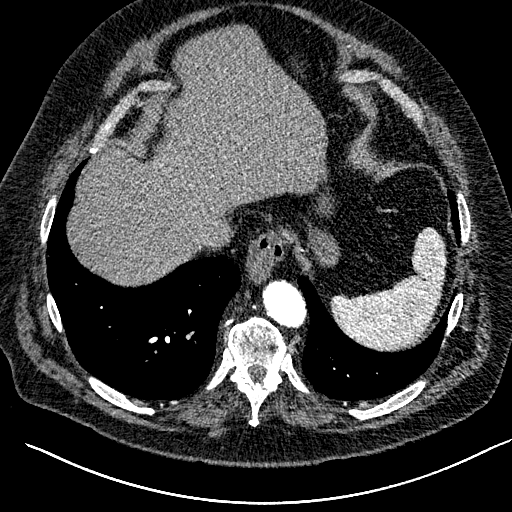
[im 66/172  lung]
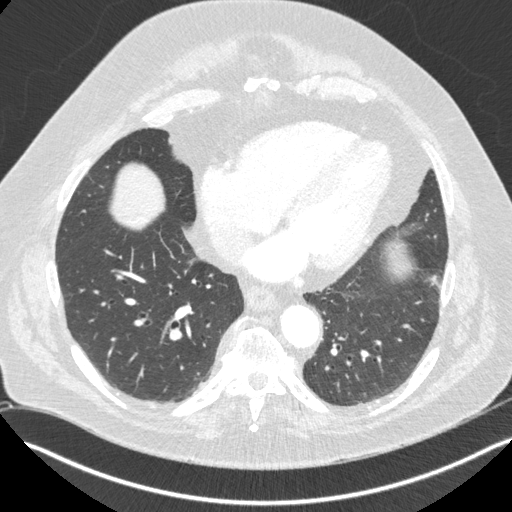
[im 79/172  mediastinal]
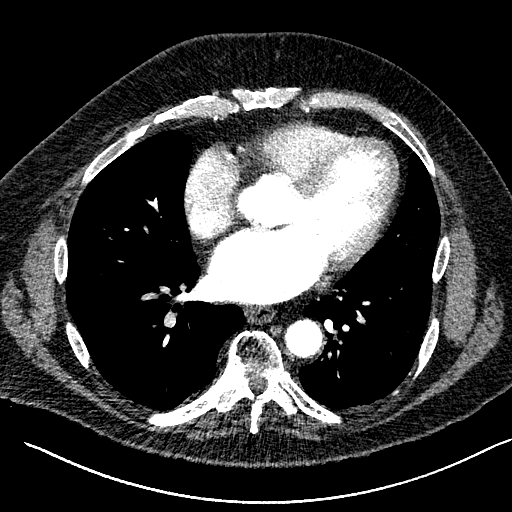
[im 93/172  lung]
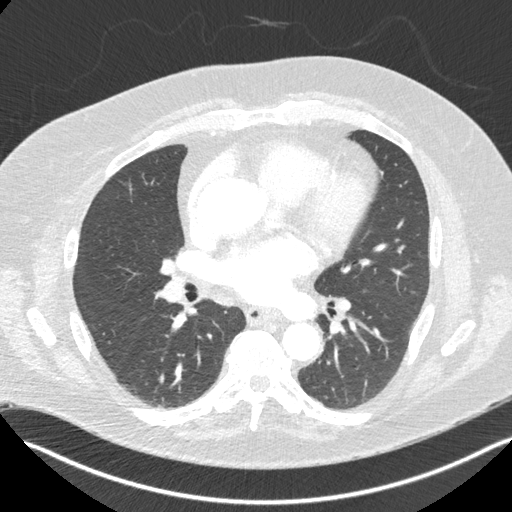
[im 106/172  mediastinal]
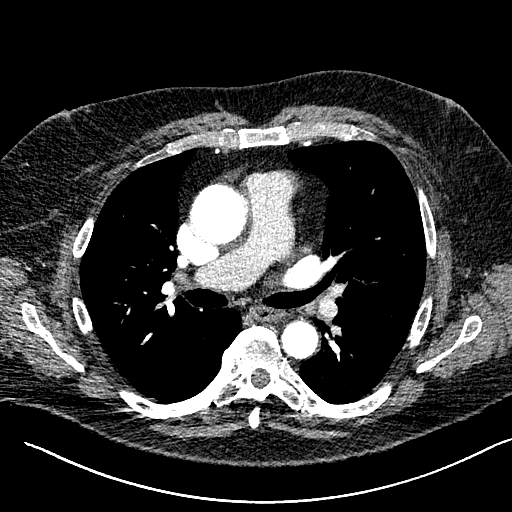
[im 119/172  lung]
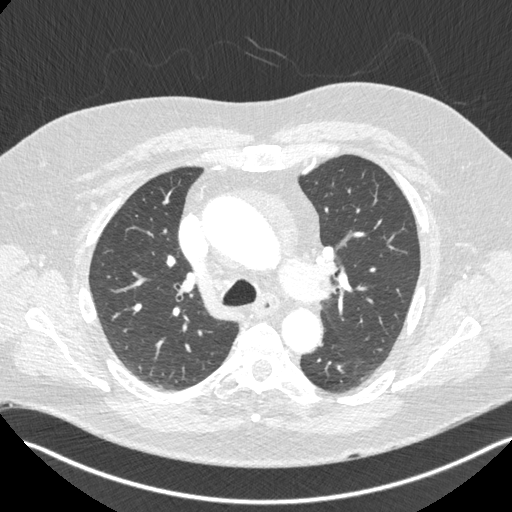
[im 132/172  mediastinal]
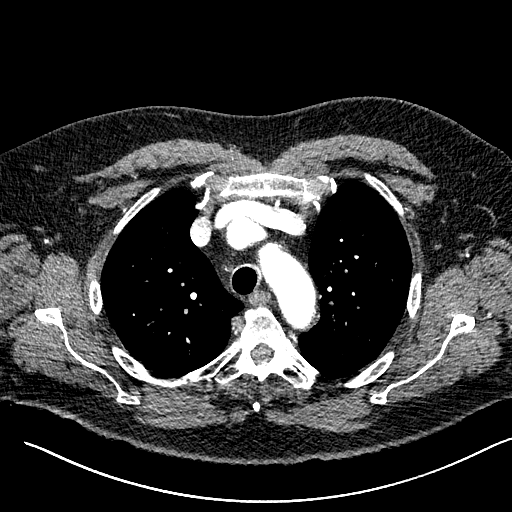
[im 145/172  lung]
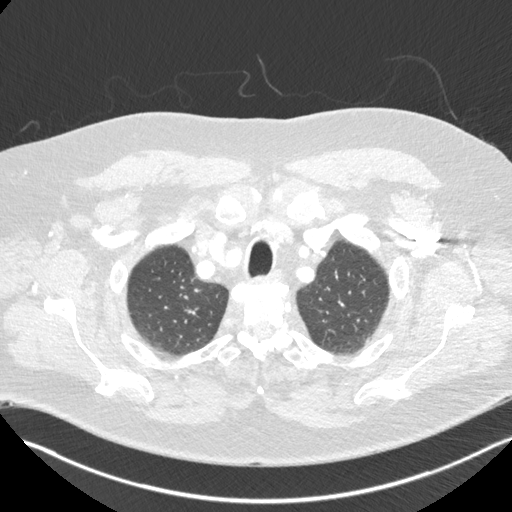
[im 158/172  mediastinal]
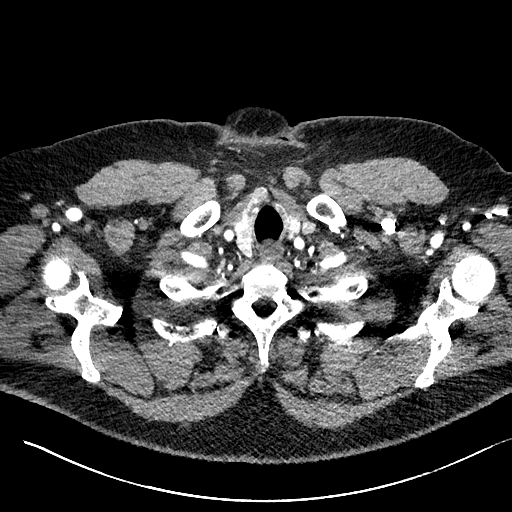

[Series 7: cor st cta thorax 2.00 cor · coronal · 0.68mm/px · 1 of 176 slices shown]
[im 88/176  mediastinal]
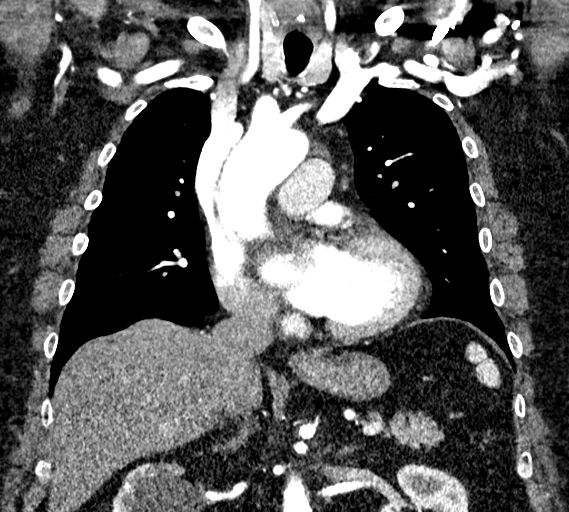

[13 of 37 positions shown; findings below may reference images not displayed]

FINDINGS: Cardiovascular: Preferential opacification of the thoracic aorta.
Mild scattered atherosclerotic calcifications. Patent throughout.
Mild biatrial cardiomegaly. No pericardial effusion.

Sinues of Valsalva: 37 mm 37 x 38 mm ,unchanged

Sinotubular Junction: 37 mm ,unchanged

Ascending Aorta: 42 mm ,unchanged

Aortic Arch: 34 mm ,unchanged

Descending aorta: 29 mm at the level of the carina ,unchanged

Branch vessels: Common origin of the brachiocephalic and left common
carotid arteries. No significant atherosclerotic changes.

Coronary arteries: Normal origins and courses. Moderate
atherosclerotic calcifications.

Main pulmonary artery: 28 mm ,unchanged. No evidence of central
pulmonary embolism.

Pulmonary veins: No anomalous pulmonary venous return. No evidence
of left atrial appendage thrombus.

Upper abdominal vasculature: Within normal limits.

Mediastinum/Nodes: No enlarged mediastinal, hilar, or axillary lymph
nodes. Thyroid gland, trachea, and esophagus demonstrate no
significant findings.

Lungs/Pleura: No focal consolidations. There are few scattered
punctate calcified granulomas in the right lung. No suspicious
pulmonary nodules. No pleural effusion or pneumothorax.

Upper Abdomen: Multiple parapelvic and cortical renal cysts in the
right kidney, incompletely visualized.

Musculoskeletal: Similar appearing scattered vertebral body
hemangiomas, the largest in the T8 vertebral body, similar to
comparison. Multilevel flowing disc osteophytes throughout the
thoracic spine. No acute osseous abnormality.

Review of the MIP images confirms the above findings.
IMPRESSION: Vascular:

1. Similar appearing fusiform ectasia of the ascending thoracic
aorta measuring up to 42 mm. Recommend annual imaging followup by
CTA or MRA. This recommendation follows 4585
ACCF/AHA/AATS/ACR/ASA/SCA/LOE/SANCHES ROJAS/EVELLYN/STANTON Guidelines for the
Diagnosis and Management of Patients with Thoracic Aortic Disease.
Circulation. 4585; 121: E266-e369. Aortic aneurysm NOS (1YRB7-20J.Q)
2. Moderate coronary and mild aortic atherosclerosis (1YRB7-5XQ.Q).

Non-Vascular:

No acute intrathoracic abnormality.

## 2022-09-08 DIAGNOSIS — I10 Essential (primary) hypertension: Secondary | ICD-10-CM | POA: Diagnosis not present

## 2022-09-08 DIAGNOSIS — I700 Atherosclerosis of aorta: Secondary | ICD-10-CM | POA: Diagnosis not present

## 2022-09-08 DIAGNOSIS — I2510 Atherosclerotic heart disease of native coronary artery without angina pectoris: Secondary | ICD-10-CM | POA: Diagnosis not present

## 2022-09-08 DIAGNOSIS — E782 Mixed hyperlipidemia: Secondary | ICD-10-CM | POA: Diagnosis not present

## 2022-09-08 DIAGNOSIS — I7121 Aneurysm of the ascending aorta, without rupture: Secondary | ICD-10-CM | POA: Diagnosis not present

## 2023-03-08 DIAGNOSIS — E782 Mixed hyperlipidemia: Secondary | ICD-10-CM | POA: Diagnosis not present

## 2023-03-08 DIAGNOSIS — I10 Essential (primary) hypertension: Secondary | ICD-10-CM | POA: Diagnosis not present

## 2023-03-08 DIAGNOSIS — I7121 Aneurysm of the ascending aorta, without rupture: Secondary | ICD-10-CM | POA: Diagnosis not present

## 2023-03-09 ENCOUNTER — Other Ambulatory Visit: Payer: Self-pay | Admitting: Student

## 2023-03-09 DIAGNOSIS — I7121 Aneurysm of the ascending aorta, without rupture: Secondary | ICD-10-CM

## 2023-03-14 DIAGNOSIS — E782 Mixed hyperlipidemia: Secondary | ICD-10-CM | POA: Diagnosis not present

## 2023-03-14 DIAGNOSIS — I10 Essential (primary) hypertension: Secondary | ICD-10-CM | POA: Diagnosis not present

## 2023-03-15 ENCOUNTER — Ambulatory Visit
Admission: RE | Admit: 2023-03-15 | Discharge: 2023-03-15 | Disposition: A | Discharge status: Home / Self Care | Payer: Medicare Other | Source: Ambulatory Visit | Attending: Student | Admitting: Student

## 2023-03-15 DIAGNOSIS — I7121 Aneurysm of the ascending aorta, without rupture: Secondary | ICD-10-CM | POA: Diagnosis not present

## 2023-03-15 MED ORDER — IOHEXOL 350 MG/ML SOLN
75.0000 mL | Freq: Once | INTRAVENOUS | Status: AC | PRN
  Administered 2023-03-15: 75 mL via INTRAVENOUS

## 2024-03-13 ENCOUNTER — Other Ambulatory Visit: Payer: Self-pay | Admitting: Student

## 2024-03-13 DIAGNOSIS — I7121 Aneurysm of the ascending aorta, without rupture: Secondary | ICD-10-CM

## 2024-03-13 DIAGNOSIS — I10 Essential (primary) hypertension: Secondary | ICD-10-CM | POA: Diagnosis not present

## 2024-03-13 DIAGNOSIS — I700 Atherosclerosis of aorta: Secondary | ICD-10-CM | POA: Diagnosis not present

## 2024-03-13 DIAGNOSIS — E782 Mixed hyperlipidemia: Secondary | ICD-10-CM | POA: Diagnosis not present

## 2024-03-13 DIAGNOSIS — I2510 Atherosclerotic heart disease of native coronary artery without angina pectoris: Secondary | ICD-10-CM | POA: Diagnosis not present

## 2024-03-27 ENCOUNTER — Ambulatory Visit
Admission: RE | Admit: 2024-03-27 | Discharge: 2024-03-27 | Disposition: A | Discharge status: Home / Self Care | Source: Ambulatory Visit | Attending: Student | Admitting: Student

## 2024-03-27 DIAGNOSIS — E041 Nontoxic single thyroid nodule: Secondary | ICD-10-CM | POA: Diagnosis not present

## 2024-03-27 DIAGNOSIS — I700 Atherosclerosis of aorta: Secondary | ICD-10-CM | POA: Diagnosis not present

## 2024-03-27 DIAGNOSIS — I7121 Aneurysm of the ascending aorta, without rupture: Secondary | ICD-10-CM | POA: Insufficient documentation

## 2024-03-27 DIAGNOSIS — I10 Essential (primary) hypertension: Secondary | ICD-10-CM | POA: Diagnosis not present

## 2024-03-27 DIAGNOSIS — I2510 Atherosclerotic heart disease of native coronary artery without angina pectoris: Secondary | ICD-10-CM | POA: Diagnosis not present

## 2024-03-27 MED ORDER — IOHEXOL 350 MG/ML SOLN
75.0000 mL | Freq: Once | INTRAVENOUS | Status: AC | PRN
  Administered 2024-03-27: 75 mL via INTRAVENOUS

## 2024-07-04 DIAGNOSIS — D692 Other nonthrombocytopenic purpura: Secondary | ICD-10-CM | POA: Diagnosis not present

## 2024-07-04 DIAGNOSIS — L570 Actinic keratosis: Secondary | ICD-10-CM | POA: Diagnosis not present

## 2024-07-04 DIAGNOSIS — D2339 Other benign neoplasm of skin of other parts of face: Secondary | ICD-10-CM | POA: Diagnosis not present

## 2024-07-04 DIAGNOSIS — C44612 Basal cell carcinoma of skin of right upper limb, including shoulder: Secondary | ICD-10-CM | POA: Diagnosis not present

## 2024-07-04 DIAGNOSIS — D1801 Hemangioma of skin and subcutaneous tissue: Secondary | ICD-10-CM | POA: Diagnosis not present

## 2024-07-04 DIAGNOSIS — L814 Other melanin hyperpigmentation: Secondary | ICD-10-CM | POA: Diagnosis not present

## 2024-07-04 DIAGNOSIS — D492 Neoplasm of unspecified behavior of bone, soft tissue, and skin: Secondary | ICD-10-CM | POA: Diagnosis not present

## 2024-07-04 DIAGNOSIS — C44519 Basal cell carcinoma of skin of other part of trunk: Secondary | ICD-10-CM | POA: Diagnosis not present

## 2024-07-04 DIAGNOSIS — L821 Other seborrheic keratosis: Secondary | ICD-10-CM | POA: Diagnosis not present

## 2024-07-04 DIAGNOSIS — L905 Scar conditions and fibrosis of skin: Secondary | ICD-10-CM | POA: Diagnosis not present

## 2024-08-01 DIAGNOSIS — C44519 Basal cell carcinoma of skin of other part of trunk: Secondary | ICD-10-CM | POA: Diagnosis not present

## 2024-09-04 ENCOUNTER — Ambulatory Visit: Admission: EM | Admit: 2024-09-04 | Discharge: 2024-09-04 | Disposition: A | Discharge status: Home / Self Care

## 2024-09-04 ENCOUNTER — Encounter: Payer: Self-pay | Admitting: Emergency Medicine

## 2024-09-04 DIAGNOSIS — L03119 Cellulitis of unspecified part of limb: Secondary | ICD-10-CM

## 2024-09-04 MED ORDER — CEPHALEXIN 500 MG PO CAPS: 500.0000 mg | ORAL_CAPSULE | Freq: Three times a day (TID) | ORAL | 0 refills | Status: AC

## 2024-09-04 MED ORDER — PREDNISONE 20 MG PO TABS: 40.0000 mg | ORAL_TABLET | Freq: Every day | ORAL | 0 refills | Status: AC

## 2024-09-04 NOTE — ED Triage Notes (Signed)
 Patient reports bilateral swelling to forearms.  Patient fell last Thursday and has abrasion to bilateral elbows. Patient has been using neosporin with improvement.  Patient states it's warm to touch. Denies pain.

## 2024-09-04 NOTE — Discharge Instructions (Signed)
 Today you are evaluated for the redness and swelling to your arms which is concerning for infection and you will be started on antibiotics  Take cephalexin  every 8 hours for 5 days for treatment of bacteria  To help reduce swelling and help with tightness begin prednisone every morning with food for 5 days  May help cool to warm compresses over the arms to help reduce swelling  May elevate the arms on pillows whenever sitting and lying to help reduce swelling  Areas on the elbows are scabbed over and therefore you do not need to clean unless the wound becomes open  Please follow-up with urgent care or your primary doctor if your symptoms do not improve

## 2024-09-04 NOTE — ED Provider Notes (Signed)
 [facility]    CSN: [id number] Arrival date & time: 09/04/24  1010      History   Chief Complaint No chief complaint on file.   HPI Larry YON Sr. is a 68 y.o. male.   Patient presents for evaluation of erythema and swelling to the left forearm beginning 3 days ago, noticed erythema and swelling to the right forearm beginning 1 day ago.  Fell approximately 6 days ago causing abrasions to the bilateral elbows, he iced and cleaned the wounds at that time.  Denies drainage or fever.  Has applied ice salve over the affected area.  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   Hypertension    Right flank pain 09/30/2015   Scrotal swelling    Umbilical hernia    Umbilical hernia without obstruction and without gangrene 07/25/2014   Varicose veins of lower extremities with other complications 2013    Patient Active Problem List   Diagnosis Date Noted   Essential hypertension 09/23/2016   Elevated ferritin 09/20/2016   Gout 09/06/2016   Prostate cancer screening 09/06/2016   Ankle edema 06/06/2016   Acid reflux 06/06/2016   Encounter for cardiac risk counseling 06/06/2016   Right flank pain 09/30/2015   Allergic rhinitis 09/02/2015   HLD (hyperlipidemia) 09/02/2015   BP (high blood pressure) 09/02/2015   Adult BMI 30+ 09/02/2015   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   HERNIA REPAIR  12/25/2015   Umbilical hernia repair with 6.4 cm Ventralex ST mesh   HYDROCELE EXCISION Right 12/25/2015    Procedure: HYDROCELECTOMY ;  Surgeon: Redell Lynwood Napoleon, MD;  Location: [facility];  Service: Urology;  Laterality: Right;   KNEE SURGERY Right 2013   arthroscopy   UMBILICAL HERNIA REPAIR N/A 12/25/2015   Procedure: HERNIA REPAIR UMBILICAL ADULT, WITH MESH;  Surgeon: Reyes LELON Cota, MD;  Location: [facility];  Service: General;  Laterality: N/A;       Home Medications    Prior to Admission medications   Medication Sig Start Date End Date Taking? Authorizing Provider  cephALEXin  (KEFLEX ) 500 MG capsule Take 1 capsule (500 mg total) by mouth 3 (three) times daily for 5 days. 09/04/24 09/09/24 Yes Janmarie Smoot R, NP  colchicine 0.6 MG tablet Take 1 tablet by mouth 2 (two) times daily. 06/25/18  Yes [provider]  pravastatin (PRAVACHOL) 40 MG tablet Take 40 mg by mouth at bedtime. 04/02/24 04/02/25 Yes [provider]  predniSONE (DELTASONE) 20 MG tablet Take 2 tablets (40 mg total) by mouth daily. 09/04/24  Yes Teresa Price R, NP  aspirin EC 81 MG tablet Take 81 mg by mouth daily.    [provider]  telmisartan (MICARDIS) 40 MG tablet Take 1 tablet by mouth daily. 08/28/16   [provider]    Family History Family History  Problem Relation Age of Onset   Stroke Father    Heart attack Father 32   Hyperlipidemia Mother    Breast cancer Mother  Colon cancer Unknown    Colon polyps Unknown    Kidney disease Neg Hx    Prostate cancer Neg Hx     Social History Social History   Tobacco Use   Smoking status: Never   Smokeless tobacco: Current    Types: Chew  Substance Use Topics   Alcohol use: Yes    Alcohol/week: 8.0 - 9.0 standard drinks of alcohol    Types: 7 Glasses of wine, 1 - 2 Cans of beer per week   Drug use: No     Allergies   Dust mite extract and Tree extract   Review of Systems Review of Systems   Physical Exam Triage Vital Signs ED Triage Vitals  Encounter Vitals Group     BP 09/04/24 1111 (!) 155/89      Girls Systolic BP Percentile --      Girls Diastolic BP Percentile --      Boys Systolic BP Percentile --      Boys Diastolic BP Percentile --      Pulse Rate 09/04/24 1111 77     Resp 09/04/24 1111 20     Temp 09/04/24 1111 99.1 F (37.3 C)     Temp Source 09/04/24 1111 Oral     SpO2 09/04/24 1111 98 %     Weight --      Height --      Head Circumference --      Peak Flow --      Pain Score 09/04/24 1115 0     Pain Loc --      Pain Education --      Exclude from Growth Chart --    No data found.  Updated Vital Signs BP (!) 155/89 (BP Location: Left Arm)   Pulse 77   Temp 99.1 F (37.3 C) (Oral)   Resp 20   SpO2 98%   Visual Acuity Right Eye Distance:   Left Eye Distance:   Bilateral Distance:    Right Eye Near:   Left Eye Near:    Bilateral Near:     Physical Exam Constitutional:      Appearance: Normal appearance.  Eyes:     Extraocular Movements: Extraocular movements intact.  Pulmonary:     Effort: Pulmonary effort is normal.  Skin:    Comments: Significant swelling to the right forearm with erythema, skin hot to touch, abrasion noted to the right elbow, tender to palpation, no drainage present  Mild to moderate swelling to the left forearm without erythema, skin warm to touch, abrasion noted to the left elbow with surrounding erythema, nontender, nondraining  Neurological:     Mental Status: He is alert and oriented to person, place, and time. Mental status is at baseline.      UC Treatments / Results  Labs (all labs ordered are listed, but only abnormal results are displayed) Labs Reviewed - No data to display  EKG   Radiology No results found.  Procedures Procedures (including critical care time)  Medications Ordered in UC Medications - No data to display  Initial Impression / Assessment and Plan / UC Course  I have reviewed the triage vital signs and the nursing notes.  Pertinent labs & imaging results that were available during my  care of the patient were reviewed by me and considered in my medical decision making (see chart for details).  Cellulitis of the elbow  Presentation concerning for infection, right side worse than left, discussed this with patient, most likely  related to abrasions, prescribed cephalexin  and prednisone recommended cool to warm compresses, elevation and over-the-counter analgesic, advised to follow-up for any persisting or worsening symptoms Final Clinical Impressions(s) / UC Diagnoses   Final diagnoses:  Cellulitis of elbow     Discharge Instructions      Today you are evaluated for the redness and swelling to your arms which is concerning for infection and you will be started on antibiotics  Take cephalexin  every 8 hours for 5 days for treatment of bacteria  To help reduce swelling and help with tightness begin prednisone every morning with food for 5 days  May help cool to warm compresses over the arms to help reduce swelling  May elevate the arms on pillows whenever sitting and lying to help reduce swelling  Areas on the elbows are scabbed over and therefore you do not need to clean unless the wound becomes open  Please follow-up with urgent care or your primary doctor if your symptoms do not improve   ED Prescriptions     Medication Sig Dispense Auth. Provider   cephALEXin  (KEFLEX ) 500 MG capsule Take 1 capsule (500 mg total) by mouth 3 (three) times daily for 5 days. 15 capsule Kenndra Morris R, NP   predniSONE (DELTASONE) 20 MG tablet Take 2 tablets (40 mg total) by mouth daily. 10 tablet Alleyah Twombly R, NP      PDMP not reviewed this encounter.   Teresa Shelba SAUNDERS, NP 09/04/24 1224

## 2024-09-05 DIAGNOSIS — I2510 Atherosclerotic heart disease of native coronary artery without angina pectoris: Secondary | ICD-10-CM | POA: Diagnosis not present

## 2024-09-05 DIAGNOSIS — G4733 Obstructive sleep apnea (adult) (pediatric): Secondary | ICD-10-CM | POA: Diagnosis not present

## 2024-09-05 DIAGNOSIS — E782 Mixed hyperlipidemia: Secondary | ICD-10-CM | POA: Diagnosis not present

## 2024-09-05 DIAGNOSIS — I700 Atherosclerosis of aorta: Secondary | ICD-10-CM | POA: Diagnosis not present

## 2024-09-05 DIAGNOSIS — I4891 Unspecified atrial fibrillation: Secondary | ICD-10-CM | POA: Diagnosis not present

## 2024-09-05 DIAGNOSIS — I10 Essential (primary) hypertension: Secondary | ICD-10-CM | POA: Diagnosis not present

## 2024-09-05 DIAGNOSIS — R002 Palpitations: Secondary | ICD-10-CM | POA: Diagnosis not present

## 2024-09-09 DIAGNOSIS — C44612 Basal cell carcinoma of skin of right upper limb, including shoulder: Secondary | ICD-10-CM | POA: Diagnosis not present

## 2024-09-09 DIAGNOSIS — L988 Other specified disorders of the skin and subcutaneous tissue: Secondary | ICD-10-CM | POA: Diagnosis not present

## 2024-09-10 DIAGNOSIS — I2510 Atherosclerotic heart disease of native coronary artery without angina pectoris: Secondary | ICD-10-CM | POA: Diagnosis not present

## 2024-09-10 DIAGNOSIS — I4891 Unspecified atrial fibrillation: Secondary | ICD-10-CM | POA: Diagnosis not present

## 2024-10-09 ENCOUNTER — Telehealth: Payer: Self-pay

## 2024-10-09 NOTE — Telephone Encounter (Signed)
 Patient has not been seen since 2017. They are on the list from [facility] that they need an appt. I have called them and they do not have VM so I was unable to leave them a message. If they call back please get them scheduled to see a PCP. If they are no longer a pt please tell them to let [facility] know and remove the current PCP.
